# Patient Record
Sex: Female | Born: 1980
Health system: Southern US, Community
[De-identification: ages and names within clinical notes are randomized; demographics above are authoritative.]

## PROBLEM LIST (undated history)

## (undated) DIAGNOSIS — E039 Hypothyroidism, unspecified: Secondary | ICD-10-CM

## (undated) DIAGNOSIS — Z23 Encounter for immunization: Secondary | ICD-10-CM

## (undated) DIAGNOSIS — L900 Lichen sclerosus et atrophicus: Secondary | ICD-10-CM

## (undated) DIAGNOSIS — E282 Polycystic ovarian syndrome: Secondary | ICD-10-CM

## (undated) DIAGNOSIS — R87810 Cervical high risk human papillomavirus (HPV) DNA test positive: Secondary | ICD-10-CM

## (undated) DIAGNOSIS — K219 Gastro-esophageal reflux disease without esophagitis: Secondary | ICD-10-CM

## (undated) DIAGNOSIS — G43909 Migraine, unspecified, not intractable, without status migrainosus: Secondary | ICD-10-CM

## (undated) DIAGNOSIS — R87610 Atypical squamous cells of undetermined significance on cytologic smear of cervix (ASC-US): Secondary | ICD-10-CM

## (undated) HISTORY — DX: Migraine, unspecified, not intractable, without status migrainosus: G43.909

## (undated) HISTORY — DX: Cervical high risk human papillomavirus (HPV) DNA test positive: R87.810

## (undated) HISTORY — DX: Atypical squamous cells of undetermined significance on cytologic smear of cervix (ASC-US): R87.610

## (undated) HISTORY — DX: Gastro-esophageal reflux disease without esophagitis: K21.9

## (undated) HISTORY — DX: Polycystic ovarian syndrome: E28.2

## (undated) HISTORY — DX: Encounter for immunization: Z23

## (undated) HISTORY — DX: Lichen sclerosus et atrophicus: L90.0

---

## 2002-03-27 DIAGNOSIS — R87610 Atypical squamous cells of undetermined significance on cytologic smear of cervix (ASC-US): Secondary | ICD-10-CM

## 2002-03-27 HISTORY — DX: Atypical squamous cells of undetermined significance on cytologic smear of cervix (ASC-US): R87.610

## 2002-03-27 HISTORY — PX: COLPOSCOPY: SHX161

## 2003-03-28 HISTORY — PX: WISDOM TOOTH EXTRACTION: SHX21

## 2005-07-24 ENCOUNTER — Ambulatory Visit: Payer: Self-pay | Admitting: Family Medicine

## 2005-09-18 ENCOUNTER — Ambulatory Visit: Payer: Self-pay | Admitting: Family Medicine

## 2006-03-13 ENCOUNTER — Ambulatory Visit: Payer: Self-pay | Admitting: Family Medicine

## 2013-06-05 ENCOUNTER — Emergency Department: Payer: Self-pay | Admitting: Emergency Medicine

## 2013-10-17 ENCOUNTER — Emergency Department: Payer: Self-pay | Admitting: Internal Medicine

## 2013-10-17 LAB — URINALYSIS, COMPLETE
BACTERIA: NONE SEEN
BILIRUBIN, UR: NEGATIVE
Blood: NEGATIVE
Glucose,UR: NEGATIVE mg/dL (ref 0–75)
Ketone: NEGATIVE
Leukocyte Esterase: NEGATIVE
Nitrite: NEGATIVE
Ph: 5 (ref 4.5–8.0)
Protein: NEGATIVE
RBC,UR: 2 /HPF (ref 0–5)
Specific Gravity: 1.021 (ref 1.003–1.030)
WBC UR: 1 /HPF (ref 0–5)

## 2013-10-17 LAB — CBC WITH DIFFERENTIAL/PLATELET
Basophil #: 0.1 10*3/uL (ref 0.0–0.1)
Basophil %: 1 %
EOS ABS: 0.6 10*3/uL (ref 0.0–0.7)
Eosinophil %: 6.3 %
HCT: 42.3 % (ref 35.0–47.0)
HGB: 14 g/dL (ref 12.0–16.0)
LYMPHS PCT: 24.4 %
Lymphocyte #: 2.2 10*3/uL (ref 1.0–3.6)
MCH: 29 pg (ref 26.0–34.0)
MCHC: 33.2 g/dL (ref 32.0–36.0)
MCV: 87 fL (ref 80–100)
MONO ABS: 0.6 x10 3/mm (ref 0.2–0.9)
Monocyte %: 6.4 %
Neutrophil #: 5.5 10*3/uL (ref 1.4–6.5)
Neutrophil %: 61.9 %
Platelet: 372 10*3/uL (ref 150–440)
RBC: 4.84 10*6/uL (ref 3.80–5.20)
RDW: 13.2 % (ref 11.5–14.5)
WBC: 8.9 10*3/uL (ref 3.6–11.0)

## 2013-10-17 LAB — COMPREHENSIVE METABOLIC PANEL
ALK PHOS: 93 U/L
ANION GAP: 7 (ref 7–16)
AST: 27 U/L (ref 15–37)
Albumin: 3.2 g/dL — ABNORMAL LOW (ref 3.4–5.0)
BUN: 7 mg/dL (ref 7–18)
Bilirubin,Total: 0.4 mg/dL (ref 0.2–1.0)
CHLORIDE: 107 mmol/L (ref 98–107)
Calcium, Total: 8.2 mg/dL — ABNORMAL LOW (ref 8.5–10.1)
Co2: 25 mmol/L (ref 21–32)
Creatinine: 0.86 mg/dL (ref 0.60–1.30)
EGFR (Non-African Amer.): >60
Glucose: 114 mg/dL — ABNORMAL HIGH (ref 65–99)
Osmolality: 276 (ref 275–301)
Potassium: 3.8 mmol/L (ref 3.5–5.1)
SGPT (ALT): 30 U/L
SODIUM: 139 mmol/L (ref 136–145)
TOTAL PROTEIN: 7.6 g/dL (ref 6.4–8.2)

## 2014-06-09 ENCOUNTER — Emergency Department: Payer: Self-pay | Admitting: Emergency Medicine

## 2015-08-02 DIAGNOSIS — E282 Polycystic ovarian syndrome: Secondary | ICD-10-CM | POA: Diagnosis not present

## 2015-08-02 DIAGNOSIS — R5383 Other fatigue: Secondary | ICD-10-CM | POA: Diagnosis not present

## 2015-08-02 DIAGNOSIS — K219 Gastro-esophageal reflux disease without esophagitis: Secondary | ICD-10-CM | POA: Insufficient documentation

## 2015-08-02 DIAGNOSIS — N926 Irregular menstruation, unspecified: Secondary | ICD-10-CM | POA: Diagnosis not present

## 2015-08-02 DIAGNOSIS — E079 Disorder of thyroid, unspecified: Secondary | ICD-10-CM | POA: Diagnosis not present

## 2015-08-02 DIAGNOSIS — Z79899 Other long term (current) drug therapy: Secondary | ICD-10-CM | POA: Diagnosis not present

## 2015-10-27 DIAGNOSIS — M7989 Other specified soft tissue disorders: Secondary | ICD-10-CM | POA: Diagnosis not present

## 2015-10-27 DIAGNOSIS — E039 Hypothyroidism, unspecified: Secondary | ICD-10-CM | POA: Diagnosis not present

## 2015-11-13 ENCOUNTER — Telehealth: Payer: 59 | Admitting: Physician Assistant

## 2015-11-13 DIAGNOSIS — J019 Acute sinusitis, unspecified: Secondary | ICD-10-CM | POA: Diagnosis not present

## 2015-11-13 DIAGNOSIS — B9689 Other specified bacterial agents as the cause of diseases classified elsewhere: Secondary | ICD-10-CM

## 2015-11-13 MED ORDER — AMOXICILLIN-POT CLAVULANATE 875-125 MG PO TABS: 1.0000 | ORAL_TABLET | Freq: Two times a day (BID) | ORAL | 0 refills | Status: DC

## 2015-11-13 NOTE — Progress Notes (Signed)

## 2015-11-29 ENCOUNTER — Telehealth: Payer: 59 | Admitting: Family

## 2015-11-29 DIAGNOSIS — H938X3 Other specified disorders of ear, bilateral: Secondary | ICD-10-CM

## 2015-11-29 NOTE — Progress Notes (Signed)
E Visit for Swimmer's Ear  We are sorry that you are not feeling well. Here is how we plan to help!  I do not think you have swimmers ear- swimmers ear is very painful- I feel you probably have a plug of wax in your ears- treatment for this would be to get some debrox drops from pharmacy. Follow directions. If this does not work the you will need to see your PCP to have ears washed out and examined. You matyy also try taking a oral decongestant that will help get any fluid out from behind er drum.    In certain cases swimmer's ear may progress to a more serious bacterial infection of the middle or inner ear.  If you have a fever 102 and up and significantly worsening symptoms, this could indicate a more serious infection moving to the middle/inner and needs face to face evaluation in an office by a provider.  Your symptoms should improve over the next 3 days and should resolve in about 7 days.  HOME CARE:   Wash your hands frequently.  Do not place the tip of the bottle on your ear or touch it with your fingers.  You can take Acetominophen 650 mg every 4-6 hours as needed for pain.  If pain is severe or moderate, you can apply a heating pad (set on low) or hot water bottle (wrapped in a towel) to outer ear for 20 minutes.  This will also increase drainage.  Avoid ear plugs  Do not use Q-tips  After showers, help the water run out by tilting your head to one side.  GET HELP RIGHT AWAY IF:   Fever is over 102.2 degrees.  You develop progressive ear pain or hearing loss.  Ear symptoms persist longer than 3 days after treatment.  MAKE SURE YOU:   Understand these instructions.  Will watch your condition.  Will get help right away if you are not doing well or get worse.  TO PREVENT SWIMMER'S EAR:  Use a bathing cap or custom fitted swim molds to keep your ears dry.  Towel off after swimming to dry your ears.  Tilt your head or pull your earlobes to allow the water to  escape your ear canal.  If there is still water in your ears, consider using a hairdryer on the lowest setting.  Thank you for choosing an e-visit. Your e-visit answers were reviewed by a board certified advanced clinical practitioner to complete your personal care plan. Depending upon the condition, your plan could have included both over the counter or prescription medications. Please review your pharmacy choice. Be sure that the pharmacy you have chosen is open so that you can pick up your prescription now.  If there is a problem you may message your provider in [place] to have the prescription routed to another pharmacy. Your safety is important to [place]. If you have drug allergies check your prescription carefully.  For the next 24 hours, you can use MyChart to ask questions about today's visit, request a non-urgent call back, or ask for a work or school excuse from your e-visit provider. You will get an email in the next two days asking about your experience. I hope that your e-visit has been valuable and will speed your recovery.

## 2016-02-02 DIAGNOSIS — Z13220 Encounter for screening for lipoid disorders: Secondary | ICD-10-CM | POA: Diagnosis not present

## 2016-02-02 DIAGNOSIS — Z79899 Other long term (current) drug therapy: Secondary | ICD-10-CM | POA: Diagnosis not present

## 2016-02-02 DIAGNOSIS — Z6841 Body Mass Index (BMI) 40.0 and over, adult: Secondary | ICD-10-CM | POA: Diagnosis not present

## 2016-02-02 DIAGNOSIS — E559 Vitamin D deficiency, unspecified: Secondary | ICD-10-CM | POA: Diagnosis not present

## 2016-02-02 DIAGNOSIS — Z0000 Encounter for general adult medical examination without abnormal findings: Secondary | ICD-10-CM | POA: Diagnosis not present

## 2016-02-02 DIAGNOSIS — E039 Hypothyroidism, unspecified: Secondary | ICD-10-CM | POA: Diagnosis not present

## 2016-03-30 ENCOUNTER — Emergency Department
Admission: EM | Admit: 2016-03-30 | Discharge: 2016-03-30 | Disposition: A | Discharge status: Home / Self Care | Payer: 59 | Attending: Emergency Medicine | Admitting: Emergency Medicine

## 2016-03-30 ENCOUNTER — Encounter: Payer: Self-pay | Admitting: Emergency Medicine

## 2016-03-30 DIAGNOSIS — R197 Diarrhea, unspecified: Secondary | ICD-10-CM

## 2016-03-30 DIAGNOSIS — A0811 Acute gastroenteropathy due to Norwalk agent: Secondary | ICD-10-CM | POA: Insufficient documentation

## 2016-03-30 DIAGNOSIS — R112 Nausea with vomiting, unspecified: Secondary | ICD-10-CM

## 2016-03-30 DIAGNOSIS — E039 Hypothyroidism, unspecified: Secondary | ICD-10-CM | POA: Diagnosis not present

## 2016-03-30 DIAGNOSIS — Z79899 Other long term (current) drug therapy: Secondary | ICD-10-CM | POA: Diagnosis not present

## 2016-03-30 HISTORY — DX: Hypothyroidism, unspecified: E03.9

## 2016-03-30 LAB — GASTROINTESTINAL PANEL BY PCR, STOOL (REPLACES STOOL CULTURE)
ASTROVIRUS: NOT DETECTED
Adenovirus F40/41: NOT DETECTED
CAMPYLOBACTER SPECIES: NOT DETECTED
Cryptosporidium: NOT DETECTED
Cyclospora cayetanensis: NOT DETECTED
ENTEROTOXIGENIC E COLI (ETEC): NOT DETECTED
Entamoeba histolytica: NOT DETECTED
Enteroaggregative E coli (EAEC): NOT DETECTED
Enteropathogenic E coli (EPEC): NOT DETECTED
Giardia lamblia: NOT DETECTED
NOROVIRUS GI/GII: DETECTED — AB
PLESIMONAS SHIGELLOIDES: NOT DETECTED
Rotavirus A: NOT DETECTED
SAPOVIRUS (I, II, IV, AND V): NOT DETECTED
SHIGA LIKE TOXIN PRODUCING E COLI (STEC): NOT DETECTED
Salmonella species: NOT DETECTED
Shigella/Enteroinvasive E coli (EIEC): NOT DETECTED
Vibrio cholerae: NOT DETECTED
Vibrio species: NOT DETECTED
Yersinia enterocolitica: NOT DETECTED

## 2016-03-30 LAB — C DIFFICILE QUICK SCREEN W PCR REFLEX
C DIFFICILE (CDIFF) INTERP: NOT DETECTED
C DIFFICILE (CDIFF) TOXIN: NEGATIVE
C DIFFICLE (CDIFF) ANTIGEN: NEGATIVE

## 2016-03-30 LAB — URINALYSIS, COMPLETE (UACMP) WITH MICROSCOPIC
Bacteria, UA: NONE SEEN
Bilirubin Urine: NEGATIVE
GLUCOSE, UA: NEGATIVE mg/dL
Hgb urine dipstick: NEGATIVE
KETONES UR: 20 mg/dL — AB
Leukocytes, UA: NEGATIVE
Nitrite: NEGATIVE
PH: 5 (ref 5.0–8.0)
Protein, ur: NEGATIVE mg/dL
Specific Gravity, Urine: 1.026 (ref 1.005–1.030)
WBC, UA: NONE SEEN WBC/hpf (ref 0–5)

## 2016-03-30 LAB — CBC WITH DIFFERENTIAL/PLATELET
Basophils Absolute: 0 10*3/uL (ref 0–0.1)
Basophils Relative: 0 %
Eosinophils Absolute: 0 10*3/uL (ref 0–0.7)
Eosinophils Relative: 0 %
HEMATOCRIT: 42.4 % (ref 35.0–47.0)
HEMOGLOBIN: 14 g/dL (ref 12.0–16.0)
LYMPHS ABS: 0.7 10*3/uL — AB (ref 1.0–3.6)
Lymphocytes Relative: 5 %
MCH: 28.3 pg (ref 26.0–34.0)
MCHC: 33.1 g/dL (ref 32.0–36.0)
MCV: 85.6 fL (ref 80.0–100.0)
MONO ABS: 0.8 10*3/uL (ref 0.2–0.9)
MONOS PCT: 6 %
NEUTROS ABS: 12.8 10*3/uL — AB (ref 1.4–6.5)
NEUTROS PCT: 89 %
Platelets: 338 10*3/uL (ref 150–440)
RBC: 4.95 MIL/uL (ref 3.80–5.20)
RDW: 14.8 % — ABNORMAL HIGH (ref 11.5–14.5)
WBC: 14.3 10*3/uL — ABNORMAL HIGH (ref 3.6–11.0)

## 2016-03-30 LAB — COMPREHENSIVE METABOLIC PANEL
ALK PHOS: 91 U/L (ref 38–126)
ALT: 23 U/L (ref 14–54)
ANION GAP: 7 (ref 5–15)
AST: 41 U/L (ref 15–41)
Albumin: 3.8 g/dL (ref 3.5–5.0)
BILIRUBIN TOTAL: 1.2 mg/dL (ref 0.3–1.2)
BUN: 12 mg/dL (ref 6–20)
CO2: 21 mmol/L — ABNORMAL LOW (ref 22–32)
Calcium: 8.5 mg/dL — ABNORMAL LOW (ref 8.9–10.3)
Chloride: 104 mmol/L (ref 101–111)
Creatinine, Ser: 0.76 mg/dL (ref 0.44–1.00)
Glucose, Bld: 118 mg/dL — ABNORMAL HIGH (ref 65–99)
Potassium: 4.7 mmol/L (ref 3.5–5.1)
Sodium: 132 mmol/L — ABNORMAL LOW (ref 135–145)
TOTAL PROTEIN: 7.6 g/dL (ref 6.5–8.1)

## 2016-03-30 LAB — LIPASE, BLOOD: LIPASE: 18 U/L (ref 11–51)

## 2016-03-30 LAB — POCT PREGNANCY, URINE: Preg Test, Ur: NEGATIVE

## 2016-03-30 LAB — RAPID INFLUENZA A&B ANTIGENS (ARMC ONLY): INFLUENZA B (ARMC): NEGATIVE

## 2016-03-30 LAB — RAPID INFLUENZA A&B ANTIGENS: Influenza A (ARMC): NEGATIVE

## 2016-03-30 MED ORDER — ONDANSETRON HCL 4 MG/2ML IJ SOLN
4.0000 mg | Freq: Once | INTRAMUSCULAR | Status: AC
  Administered 2016-03-30: 4 mg via INTRAVENOUS
  Filled 2016-03-30: qty 2

## 2016-03-30 MED ORDER — LOPERAMIDE HCL 2 MG PO TABS: 2.0000 mg | ORAL_TABLET | Freq: Four times a day (QID) | ORAL | 0 refills | Status: DC | PRN

## 2016-03-30 MED ORDER — ACETAMINOPHEN 500 MG PO TABS
1000.0000 mg | ORAL_TABLET | Freq: Once | ORAL | Status: AC
  Administered 2016-03-30: 1000 mg via ORAL
  Filled 2016-03-30: qty 2

## 2016-03-30 MED ORDER — SODIUM CHLORIDE 0.9 % IV BOLUS (SEPSIS)
1000.0000 mL | Freq: Once | INTRAVENOUS | Status: AC
  Administered 2016-03-30: 1000 mL via INTRAVENOUS

## 2016-03-30 MED ORDER — ONDANSETRON HCL 4 MG PO TABS: 4.0000 mg | ORAL_TABLET | Freq: Every day | ORAL | 0 refills | Status: DC | PRN

## 2016-03-30 MED ORDER — ONDANSETRON HCL 4 MG/2ML IJ SOLN
INTRAMUSCULAR | Status: AC
  Filled 2016-03-30: qty 2

## 2016-03-30 NOTE — ED Provider Notes (Signed)
Patient presented to the emergency department with nausea vomiting and diarrhea. Abdominal pain free. Pending stool studies at this time. Able to take sips off of soda without vomiting at this time.   Physical Exam  BP 119/68   Pulse 98   Temp 98.3 F (36.8 C) (Oral)   Resp 16   Ht 5\' 2"  (1.575 m)   Wt 240 lb (108.9 kg)   LMP 03/06/2016   SpO2 91%   BMI 43.90 kg/m  ----------------------------------------- 10:34 AM on 03/30/2016 -----------------------------------------   Physical Exam Patient resting comfortably at this time. No distress.  ED Course  Procedures  MDM Updated patient about her diagnosis and the need to stay hydrated. She is understanding the plan for discharge to home. She'll be discharged with Zofran as well as Imodium.       Orbie Pyo, MD 03/30/16 1034

## 2016-03-30 NOTE — ED Provider Notes (Signed)
[facility] Emergency Department Provider Note    First MD Initiated Contact with Patient 03/30/16 [phone]     (approximate)  I have reviewed the triage vital signs and the nursing notes.   HISTORY  Chief Complaint Chills; Emesis; and Diarrhea   HPI Erika Henry is a 36 y.o. female with history of hypothyroidism and PCA was presents to the emergency department abrupt onset of generalized muscle aches last night followed by abrupt onset of nonbloody vomiting and diarrhea of which patient states she's had multiple episodes. Patient also admits to fever at home and chills. Patient states that she took Tylenol at 11:45 PM. Patient denies any abdominal discomfort. Patient denies any urinary symptoms.   Past Medical History:  Diagnosis Date  . Hypothyroid     There are no active problems to display for this patient.   History reviewed. No pertinent surgical history.  Prior to Admission medications   Medication Sig Start Date End Date Taking? Authorizing Provider  levothyroxine (SYNTHROID, LEVOTHROID) 88 MCG tablet Take 88 mcg by mouth daily before breakfast.   Yes Historical Provider, MD  Multiple Vitamin (MULTIVITAMIN WITH MINERALS) TABS tablet Take 1 tablet by mouth daily.   Yes Historical Provider, MD  omeprazole (PRILOSEC) 20 MG capsule Take 20 mg by mouth daily.   Yes Historical Provider, MD  amoxicillin-clavulanate (AUGMENTIN) 875-125 MG tablet Take 1 tablet by mouth 2 (two) times daily. Patient not taking: Reported on 03/30/2016 11/13/15   Brunetta Jeans, PA-C    Allergies Patient has no known allergies.  History reviewed. No pertinent family history.  Social History Social History  Substance Use Topics  . Smoking status: Never Smoker  . Smokeless tobacco: Never Used  . Alcohol use No    Review of Systems Constitutional: Positive for fever/chills Eyes: No visual changes. ENT: No sore throat. Cardiovascular: Denies chest  pain. Respiratory: Denies shortness of breath. Gastrointestinal: Positive for vomiting and diarrhea Genitourinary: Negative for dysuria. Musculoskeletal: Negative for back pain. Positive for generalized muscle aches Skin: Negative for rash. Neurological: Negative for headaches, focal weakness or numbness.  10-point ROS otherwise negative.  ____________________________________________   PHYSICAL EXAM:  VITAL SIGNS: ED Triage Vitals  Enc Vitals Group     BP 03/30/16 0417 137/84     Pulse Rate 03/30/16 0417 100     Resp 03/30/16 0417 (!) 22     Temp 03/30/16 0417 98.3 F (36.8 C)     Temp Source 03/30/16 0417 Oral     SpO2 03/30/16 0417 94 %     Weight 03/30/16 0418 240 lb (108.9 kg)     Height 03/30/16 0418 5\' 2"  (1.575 m)     Head Circumference --      Peak Flow --      Pain Score 03/30/16 0418 3     Pain Loc --      Pain Edu? --      Excl. in [place]? --     Constitutional: Alert and oriented. Well appearing and in no acute distress. Eyes: Conjunctivae are normal. PERRL. EOMI. Head: Atraumatic.[name] Kitchen Mouth/Throat: Mucous membranes are moist.  Oropharynx non-erythematous. Neck: No stridor.  No meningeal signs.  Cardiovascular: Normal rate, regular rhythm. Good peripheral circulation. Grossly normal heart sounds. Respiratory: Normal respiratory effort.  No retractions. Lungs CTAB. Gastrointestinal: Soft and nontender. No distention. Musculoskeletal: No lower extremity tenderness nor edema. No gross deformities of extremities. Neurologic:  Normal speech and language. No gross focal neurologic deficits are appreciated.  Skin:  Skin is warm, dry and intact. No rash noted. Psychiatric: Mood and affect are normal. Speech and behavior are normal.  ____________________________________________   LABS (all labs ordered are listed, but only abnormal results are displayed)  Labs Reviewed  CBC WITH DIFFERENTIAL/PLATELET - Abnormal; Notable for the following:       Result Value   WBC  14.3 (*)    RDW 14.8 (*)    Neutro Abs 12.8 (*)    Lymphs Abs 0.7 (*)    All other components within normal limits  COMPREHENSIVE METABOLIC PANEL - Abnormal; Notable for the following:    Sodium 132 (*)    CO2 21 (*)    Glucose, Bld 118 (*)    Calcium 8.5 (*)    All other components within normal limits  URINALYSIS, COMPLETE (UACMP) WITH MICROSCOPIC - Abnormal; Notable for the following:    Color, Urine AMBER (*)    APPearance TURBID (*)    Ketones, ur 20 (*)    Squamous Epithelial / LPF 0-5 (*)    All other components within normal limits  RAPID INFLUENZA A&B ANTIGENS ([facility] ONLY)  GASTROINTESTINAL PANEL BY PCR, STOOL (REPLACES STOOL CULTURE)  C DIFFICILE QUICK SCREEN W PCR REFLEX  LIPASE, BLOOD  POC URINE PREG, ED  POCT PREGNANCY, URINE    Procedures   ___   INITIAL IMPRESSION / ASSESSMENT AND PLAN / ED COURSE  Pertinent labs & imaging results that were available during my care of the patient were reviewed by me and considered in my medical decision making (see chart for details).  36 year old female presenting to the emergency department with nonbloody emesis and diarrhea times multiple episodes tonight. Patient given IV Zofran 4 mg as well as 2 L IV normal saline. No abdominal pain elicited on exam and therefore no imaging was performed. Suspect possible viral/bacterial etiology and gastroenteritis. Stool studies pending at this time. Patient's care transferred to [place]     ____________________________________________  FINAL CLINICAL IMPRESSION(S) / ED DIAGNOSES  Vomiting and diarrhea  MEDICATIONS GIVEN DURING THIS VISIT:  Medications  sodium chloride 0.9 % bolus 1,000 mL (0 mLs Intravenous Stopped 03/30/16 0543)  ondansetron (ZOFRAN) injection 4 mg (4 mg Intravenous Given 03/30/16 0503)     NEW OUTPATIENT MEDICATIONS STARTED DURING THIS VISIT:  New Prescriptions   No medications on file    Modified Medications   No medications  on file    Discontinued Medications   No medications on file     Note:  This document was prepared using Dragon voice recognition software and may include unintentional dictation errors.    Gregor Hams, MD 03/30/16 [phone]

## 2016-03-30 NOTE — ED Triage Notes (Signed)
Pt ambulatory to tx room in [facility], reports this evening had fever and chills, then has had vomiting and diarrhea, too many times to count.  Reports took tylenol at 2345 last night.

## 2016-05-24 DIAGNOSIS — E039 Hypothyroidism, unspecified: Secondary | ICD-10-CM | POA: Insufficient documentation

## 2016-05-24 DIAGNOSIS — K219 Gastro-esophageal reflux disease without esophagitis: Secondary | ICD-10-CM | POA: Diagnosis not present

## 2016-05-24 DIAGNOSIS — Z131 Encounter for screening for diabetes mellitus: Secondary | ICD-10-CM | POA: Diagnosis not present

## 2016-05-24 DIAGNOSIS — E559 Vitamin D deficiency, unspecified: Secondary | ICD-10-CM | POA: Insufficient documentation

## 2016-05-24 DIAGNOSIS — Z6841 Body Mass Index (BMI) 40.0 and over, adult: Secondary | ICD-10-CM | POA: Insufficient documentation

## 2016-06-09 DIAGNOSIS — E039 Hypothyroidism, unspecified: Secondary | ICD-10-CM | POA: Diagnosis not present

## 2016-06-09 DIAGNOSIS — R634 Abnormal weight loss: Secondary | ICD-10-CM | POA: Diagnosis not present

## 2016-06-09 DIAGNOSIS — E282 Polycystic ovarian syndrome: Secondary | ICD-10-CM | POA: Diagnosis not present

## 2016-06-09 DIAGNOSIS — Z3169 Encounter for other general counseling and advice on procreation: Secondary | ICD-10-CM | POA: Diagnosis not present

## 2016-06-13 DIAGNOSIS — Z3049 Encounter for surveillance of other contraceptives: Secondary | ICD-10-CM | POA: Diagnosis not present

## 2016-06-13 DIAGNOSIS — Z3009 Encounter for other general counseling and advice on contraception: Secondary | ICD-10-CM | POA: Diagnosis not present

## 2016-06-19 DIAGNOSIS — H52223 Regular astigmatism, bilateral: Secondary | ICD-10-CM | POA: Diagnosis not present

## 2016-06-19 DIAGNOSIS — H5213 Myopia, bilateral: Secondary | ICD-10-CM | POA: Diagnosis not present

## 2016-06-21 DIAGNOSIS — L400 Psoriasis vulgaris: Secondary | ICD-10-CM | POA: Diagnosis not present

## 2016-06-21 DIAGNOSIS — Z808 Family history of malignant neoplasm of other organs or systems: Secondary | ICD-10-CM | POA: Diagnosis not present

## 2016-06-21 DIAGNOSIS — L650 Telogen effluvium: Secondary | ICD-10-CM | POA: Diagnosis not present

## 2016-06-21 DIAGNOSIS — L99 Other disorders of skin and subcutaneous tissue in diseases classified elsewhere: Secondary | ICD-10-CM | POA: Diagnosis not present

## 2016-07-04 ENCOUNTER — Telehealth: Payer: 59 | Admitting: Nurse Practitioner

## 2016-07-04 DIAGNOSIS — J019 Acute sinusitis, unspecified: Secondary | ICD-10-CM | POA: Diagnosis not present

## 2016-07-04 MED ORDER — FLUTICASONE PROPIONATE 50 MCG/ACT NA SUSP: 2.0000 | Freq: Every day | NASAL | 0 refills | Status: DC

## 2016-07-04 MED ORDER — AZITHROMYCIN 250 MG PO TABS: ORAL_TABLET | ORAL | 0 refills | Status: AC

## 2016-07-04 NOTE — Progress Notes (Signed)
We are sorry that you are not feeling well.  Here is how we plan to help!  Based on what you have shared with me it looks like you have sinusitis.  Sinusitis is inflammation and infection in the sinus cavities of the head.  Based on your presentation I believe you most likely have Acute Viral Sinusitis.This is an infection most likely caused by a virus. There is not specific treatment for viral sinusitis other than to help you with the symptoms until the infection runs its course.  You may use an oral decongestant such as Mucinex D or if you have glaucoma or high blood pressure use plain Mucinex. Saline nasal spray help and can safely be used as often as needed for congestion, I have prescribed: Fluticasone nasal spray two sprays in each nostril twice a day.  Due to the severity of your symptoms, I am also prescribing Azithromycin 250mg  to take as directed.   Some authorities believe that zinc sprays or the use of Echinacea may shorten the course of your symptoms.  Sinus infections are not as easily transmitted as other respiratory infection, however we still recommend that you avoid close contact with loved ones, especially the very young and elderly.  Remember to wash your hands thoroughly throughout the day as this is the number one way to prevent the spread of infection!  Home Care:  Only take medications as instructed by your medical team.  Complete the entire course of an antibiotic.  Do not take these medications with alcohol.  A steam or ultrasonic humidifier can help congestion.  You can place a towel over your head and breathe in the steam from hot water coming from a faucet.  Avoid close contacts especially the very young and the elderly.  Cover your mouth when you cough or sneeze.  Always remember to wash your hands.  Get Help Right Away If:  You develop worsening fever or sinus pain.  You develop a severe head ache or visual changes.  Your symptoms persist after you have  completed your treatment plan.  Make sure you  Understand these instructions.  Will watch your condition.  Will get help right away if you are not doing well or get worse.  Your e-visit answers were reviewed by a board certified advanced clinical practitioner to complete your personal care plan.  Depending on the condition, your plan could have included both over the counter or prescription medications.  If there is a problem please reply  once you have received a response from your provider.  Your safety is important to [place].  If you have drug allergies check your prescription carefully.    You can use MyChart to ask questions about today's visit, request a non-urgent call back, or ask for a work or school excuse for 24 hours related to this e-Visit. If it has been greater than 24 hours you will need to follow up with your provider, or enter a new e-Visit to address those concerns.  You will get an e-mail in the next two days asking about your experience.  I hope that your e-visit has been valuable and will speed your recovery. Thank you for using e-visits.

## 2016-07-06 ENCOUNTER — Ambulatory Visit: Payer: Self-pay | Admitting: Physician Assistant

## 2016-07-06 ENCOUNTER — Encounter: Payer: Self-pay | Admitting: Physician Assistant

## 2016-07-06 VITALS — BP 140/80 | HR 107 | Temp 98.5°F

## 2016-07-06 DIAGNOSIS — R509 Fever, unspecified: Secondary | ICD-10-CM

## 2016-07-06 DIAGNOSIS — J209 Acute bronchitis, unspecified: Secondary | ICD-10-CM

## 2016-07-06 LAB — POCT INFLUENZA A/B
INFLUENZA A, POC: NEGATIVE
INFLUENZA B, POC: NEGATIVE

## 2016-07-06 MED ORDER — ALBUTEROL SULFATE HFA 108 (90 BASE) MCG/ACT IN AERS: 2.0000 | INHALATION_SPRAY | Freq: Four times a day (QID) | RESPIRATORY_TRACT | 0 refills | Status: DC | PRN

## 2016-07-06 MED ORDER — IPRATROPIUM-ALBUTEROL 0.5-2.5 (3) MG/3ML IN SOLN
3.0000 mL | Freq: Once | RESPIRATORY_TRACT | Status: AC
  Administered 2016-07-06: 3 mL via RESPIRATORY_TRACT

## 2016-07-06 MED ORDER — METHYLPREDNISOLONE 4 MG PO TBPK: ORAL_TABLET | ORAL | 0 refills | Status: DC

## 2016-07-06 MED ORDER — IPRATROPIUM-ALBUTEROL 0.5-2.5 (3) MG/3ML IN SOLN: 3.0000 mL | Freq: Four times a day (QID) | RESPIRATORY_TRACT | Status: DC

## 2016-07-06 MED ORDER — BENZONATATE 200 MG PO CAPS: 200.0000 mg | ORAL_CAPSULE | Freq: Three times a day (TID) | ORAL | 0 refills | Status: DC | PRN

## 2016-07-06 NOTE — Progress Notes (Signed)
S: C/o runny nose and congestion with dry cough for 3-4 days, + fever, chills earlier in the week, denies cp/sob, v/d; mucus was green this am but clear throughout the day, cough is sporadic, causes her head to hurt, called e-visit and they gave her a zpack  Using otc meds: robitussin  O: PE: vitals: pulse ox is low and hr is increased, nad,  perrl eomi, normocephalic, tms dull, nasal mucosa red and swollen, throat injected, neck supple no lymph, lungs c t a, cv rrr, neuro intact, flu swab neg, svn duoneb given, pulse ox increased to 94-95%, pt states she feels better  A:  Acute flu like illness, bronchitis   P: drink fluids, continue regular meds , use otc meds of choice, return if not improving in 5 days, return earlier if worsening . Medrol dose pack, albuterol inhaler, tessalon perls

## 2016-07-31 DIAGNOSIS — Z3049 Encounter for surveillance of other contraceptives: Secondary | ICD-10-CM | POA: Diagnosis not present

## 2016-07-31 DIAGNOSIS — R030 Elevated blood-pressure reading, without diagnosis of hypertension: Secondary | ICD-10-CM | POA: Diagnosis not present

## 2016-08-05 ENCOUNTER — Telehealth: Payer: 59 | Admitting: Physician Assistant

## 2016-08-05 DIAGNOSIS — H5710 Ocular pain, unspecified eye: Secondary | ICD-10-CM

## 2016-08-05 DIAGNOSIS — S058X9A Other injuries of unspecified eye and orbit, initial encounter: Secondary | ICD-10-CM

## 2016-08-05 NOTE — Progress Notes (Signed)
Based on what you shared with me it looks like you have a condition that should be evaluated in a face to face office visit.  Giving trauma there is concern for scratched cornea. Giving the amount of pain you need to be seen so a good eye examination can be performed and correct medication to be given.   NOTE: Even if you have entered your credit card information for this eVisit, you will not be charged.   If you are having a true medical emergency please call 911.  If you need an urgent face to face visit, [place] has four urgent care centers for your convenience.  If you need care fast and have a high deductible or no insurance consider:   [email or web address]  [phone]  [address], [place], [place] [address] 8 am to 8 pm Monday-Friday 10 am to 4 pm Saturday-Sunday   The following sites will take your  insurance:    . [facility] [place] a Provider at this Location  [address] [place], [place] [address] . 10 am to 8 pm Monday-Friday . 12 pm to 8 pm Saturday-Sunday   . [facility] [place] a Provider at this Location  [address], [place], [place] [address] . 8 am to 8 pm Monday-Friday . 9 am to 6 pm Saturday . 11 am to 6 pm Sunday   . [facility] [place] Get Driving Directions  [address], [place] [address] . 8 am to 8 pm Monday-Friday . 8 am to 4 pm Saturday-Sunday   Your e-visit answers were reviewed by a board certified advanced clinical practitioner to complete your personal care plan.  Thank you for using e-Visits.

## 2016-08-06 ENCOUNTER — Telehealth: Payer: 59 | Admitting: Family

## 2016-08-06 DIAGNOSIS — H1032 Unspecified acute conjunctivitis, left eye: Secondary | ICD-10-CM | POA: Diagnosis not present

## 2016-08-06 MED ORDER — POLYMYXIN B-TRIMETHOPRIM 10000-0.1 UNIT/ML-% OP SOLN: 1.0000 [drp] | Freq: Four times a day (QID) | OPHTHALMIC | 0 refills | Status: DC

## 2016-08-06 NOTE — Progress Notes (Signed)

## 2017-02-09 ENCOUNTER — Ambulatory Visit (INDEPENDENT_AMBULATORY_CARE_PROVIDER_SITE_OTHER): Payer: 59 | Admitting: Primary Care

## 2017-02-09 ENCOUNTER — Encounter: Payer: Self-pay | Admitting: Primary Care

## 2017-02-09 VITALS — BP 114/70 | HR 62 | Temp 98.6°F | Ht 62.0 in | Wt 256.1 lb

## 2017-02-09 DIAGNOSIS — Z6841 Body Mass Index (BMI) 40.0 and over, adult: Secondary | ICD-10-CM

## 2017-02-09 DIAGNOSIS — K219 Gastro-esophageal reflux disease without esophagitis: Secondary | ICD-10-CM

## 2017-02-09 DIAGNOSIS — E039 Hypothyroidism, unspecified: Secondary | ICD-10-CM | POA: Diagnosis not present

## 2017-02-09 DIAGNOSIS — E282 Polycystic ovarian syndrome: Secondary | ICD-10-CM

## 2017-02-09 DIAGNOSIS — L409 Psoriasis, unspecified: Secondary | ICD-10-CM

## 2017-02-09 LAB — TSH: TSH: 3.21 u[IU]/mL (ref 0.35–4.50)

## 2017-02-09 LAB — T4, FREE: FREE T4: 1.09 ng/dL (ref 0.60–1.60)

## 2017-02-09 MED ORDER — OMEPRAZOLE 20 MG PO CPDR: 20.0000 mg | DELAYED_RELEASE_CAPSULE | Freq: Every day | ORAL | 0 refills | Status: DC | PRN

## 2017-02-09 MED ORDER — BETAMETHASONE DIPROPIONATE 0.05 % EX EMUL: 1.0000 | Freq: Every evening | CUTANEOUS | 0 refills | Status: DC | PRN

## 2017-02-09 NOTE — Assessment & Plan Note (Addendum)
Diagnosed in 2008.  Recheck TSH today. Labs from Care Everywhere reviewed from February 2018. Repeat thyroid ultrasound given goiter history and no recent imaging.

## 2017-02-09 NOTE — Assessment & Plan Note (Signed)
Discussed to increase vegetables, fruit, whole grains, lean protein. Limit sweets.  Discussed downloading free Apps such as My Fitness Pal for calorie tracking. Discussed to limit calories to 1400-1500 daily.

## 2017-02-09 NOTE — Progress Notes (Signed)
Subjective:    Patient ID: Erika Henry, female    DOB: Mar 05, 1981, 36 y.o.   MRN: [id number]  HPI  [name] is a 36 year old female who presents today to establish care and discuss the problems mentioned below. Will obtain old records.  1) Hypothyroidism: Diagnosed in 2008. Currently managed on levothyroxine 88 mcg. Her last TSH was 4.030 in February 2018. She has recently noticed hair loss over the past 3 months. History of goiter, last ultrasound was around 2011. She's been on the 88 mcg dose of her levothyroxine for over one year.   2) GERD: Currently managed on omeprazole 20 mg for which she takes on an as needed basis. She typically takes this once to twice weekly. She is needing a refill today.  3) Psoriasis: Currently managed on betamethasone dipropionate for which she uses at bedtime PRN. She is needing a refill of her   4) Morbid Obesity: Struggled with for years. She endorses a healthy diet and regular exercise.  Diet currently consists of:  Breakfast: Atkins shake, eggs and bacon Lunch: Sandwich, pretzels, fruit and peanut butter, yogurt Dinner: Baked chicken, some red meat, green beans, potatoes, cabbage, broccoli  Snacks: Pretzels, fruit with peanut butter, chex mix, [employer] Desserts: Daily, small portion Beverages: Water, diet soda daily, socially  Exercise: She does an exercise class at the gym twice weekly, sometimes works out on Saturday.    Review of Systems  Constitutional: Negative for fatigue.  Respiratory: Negative for shortness of breath.   Cardiovascular: Negative for chest pain.  Gastrointestinal:       Intermittent GERD  Endocrine:       Hair loss  Skin:       Psoriasis        Past Medical History:  Diagnosis Date  . GERD (gastroesophageal reflux disease)   . Hypothyroid   . Migraines   . PCOS (polycystic ovarian syndrome)      Social History   Socioeconomic History  . Marital status: Married    Spouse name: Not on file  .  Number of children: Not on file  . Years of education: Not on file  . Highest education level: Not on file  Social Needs  . Financial resource strain: Not on file  . Food insecurity - worry: Not on file  . Food insecurity - inability: Not on file  . Transportation needs - medical: Not on file  . Transportation needs - non-medical: Not on file  Occupational History  . Not on file  Tobacco Use  . Smoking status: Never Smoker  . Smokeless tobacco: Never Used  Substance and Sexual Activity  . Alcohol use: No  . Drug use: Not on file  . Sexual activity: Not on file  Other Topics Concern  . Not on file  Social History Narrative   Married.   No children.   Works as a Technical brewer at [employer].   Enjoys exercising, shopping, bowling, traveling.    No past surgical history on file.  Family History  Problem Relation Age of Onset  . Hyperlipidemia Mother   . Hypertension Mother   . Hyperlipidemia Father   . Hypertension Father   . Depression Brother   . Depression Maternal Grandmother   . Diabetes Maternal Grandmother   . Stroke Maternal Grandmother   . COPD Maternal Grandfather   . Diabetes Maternal Grandfather   . Stroke Maternal Grandfather   . Depression Paternal Grandmother   . Breast cancer  Paternal Grandmother   . Arthritis Paternal Grandfather   . Cancer Paternal Grandfather   . Heart disease Paternal Grandfather   . Hyperlipidemia Paternal Grandfather   . Hypertension Paternal Grandfather   . Kidney disease Paternal Grandfather     No Known Allergies  Current Outpatient Medications on File Prior to Visit  Medication Sig Dispense Refill  . Biotin 2500 MCG CAPS Take by mouth.    . Cholecalciferol (VITAMIN D3) 2000 units capsule Take by mouth.    . levothyroxine (SYNTHROID, LEVOTHROID) 88 MCG tablet Take 88 mcg by mouth daily before breakfast.    . Multiple Vitamin (MULTI-VITAMINS) TABS Take by mouth.     No current facility-administered medications on file  prior to visit.     BP 114/70   Pulse 62   Temp 98.6 F (37 C) (Oral)   Ht 5\' 2"  (1.575 m)   Wt 256 lb 1.9 oz (116.2 kg)   LMP 01/24/2017   SpO2 97%   BMI 46.84 kg/m    Objective:   Physical Exam  Constitutional: She appears well-nourished.  Neck: Neck supple. No thyromegaly present.  No nodules palpated, however, exam difficulty due to obesity  Cardiovascular: Normal rate and regular rhythm.  Pulmonary/Chest: Effort normal and breath sounds normal.  Skin: Skin is warm and dry.  Psychiatric: She has a normal mood and affect.          Assessment & Plan:

## 2017-02-09 NOTE — Assessment & Plan Note (Signed)
Doing well on betamethasone spray PRN. Continue same and will refill now. Recommended that she follow back up with dermatology if symptoms do not improve with spray.

## 2017-02-09 NOTE — Patient Instructions (Addendum)
Complete lab work prior to leaving today. I will notify you of your results once received.   You will be contacted regarding your ultrasound of the thyroid gland.  Please let us know if you have not heard back within one week.   I sent refills for your skin spray and omeprazole to the pharmacy.  Start exercising. You should be getting 150 minutes of moderate intensity exercise weekly.  Down load the App My Fitness Pal for calorie tracking.  Increase consumption of vegetables, fruit, whole grains, lean protein. Reduce sweets.  Ensure you are consuming 64 ounces of water daily.  It was a pleasure to meet you today! Please don't hesitate to call me with any questions. Welcome to [facility]!

## 2017-02-09 NOTE — Assessment & Plan Note (Signed)
Taking omeprazole 20 mg PRN. Discussed trigger foods for GERD. Encouraged weight loss.

## 2017-02-09 NOTE — Assessment & Plan Note (Signed)
Diagnosed several years ago, confirmed on ultrasound. Also with history of fibroid. Currently following with GYN.

## 2017-02-10 ENCOUNTER — Ambulatory Visit (HOSPITAL_BASED_OUTPATIENT_CLINIC_OR_DEPARTMENT_OTHER)
Admission: RE | Admit: 2017-02-10 | Discharge: 2017-02-10 | Disposition: A | Discharge status: Home / Self Care | Payer: 59 | Source: Ambulatory Visit | Attending: Primary Care | Admitting: Primary Care

## 2017-02-10 DIAGNOSIS — E041 Nontoxic single thyroid nodule: Secondary | ICD-10-CM | POA: Diagnosis not present

## 2017-02-10 DIAGNOSIS — E039 Hypothyroidism, unspecified: Secondary | ICD-10-CM | POA: Insufficient documentation

## 2017-02-12 ENCOUNTER — Telehealth: Payer: Self-pay | Admitting: Primary Care

## 2017-02-12 DIAGNOSIS — L409 Psoriasis, unspecified: Secondary | ICD-10-CM

## 2017-02-12 NOTE — Telephone Encounter (Signed)
Received faxed from [place] that need to change   Betamethasone Dipropionate (SERNIVO) 0.05 %   Noted from the pharmacy: Insurance requires trial of Kenalog first

## 2017-02-13 MED ORDER — TRIAMCINOLONE ACETONIDE 0.147 MG/GM EX AERS: INHALATION_SPRAY | Freq: Two times a day (BID) | CUTANEOUS | 0 refills | Status: DC

## 2017-02-13 NOTE — Telephone Encounter (Signed)
Please notify patient of what the insurance is stating.  She may want to follow back up with the dermatologist for additional samples. If she doesn't want to go back to dermatology then what else has she tried for the psoriasis?

## 2017-02-13 NOTE — Telephone Encounter (Signed)
Noted.  Prescription sent to pharmacy. 

## 2017-02-13 NOTE — Telephone Encounter (Signed)
Spoken and notified patient of Kate's comments. Patient verbalized understanding.  Patient stated that she does not want to go back to the dermatologist.  She have only tried OTC before. Patient is willing to try Kenalog and see.

## 2017-02-17 ENCOUNTER — Encounter: Payer: Self-pay | Admitting: Family Medicine

## 2017-02-17 ENCOUNTER — Ambulatory Visit (INDEPENDENT_AMBULATORY_CARE_PROVIDER_SITE_OTHER): Payer: 59 | Admitting: Family Medicine

## 2017-02-17 VITALS — BP 128/90 | HR 83 | Temp 99.8°F | Resp 18 | Ht 62.0 in | Wt 254.0 lb

## 2017-02-17 DIAGNOSIS — J029 Acute pharyngitis, unspecified: Secondary | ICD-10-CM | POA: Diagnosis not present

## 2017-02-17 DIAGNOSIS — R6889 Other general symptoms and signs: Secondary | ICD-10-CM | POA: Diagnosis not present

## 2017-02-17 DIAGNOSIS — M791 Myalgia, unspecified site: Secondary | ICD-10-CM | POA: Diagnosis not present

## 2017-02-17 DIAGNOSIS — R05 Cough: Secondary | ICD-10-CM | POA: Diagnosis not present

## 2017-02-17 MED ORDER — BENZONATATE 100 MG PO CAPS: 100.0000 mg | ORAL_CAPSULE | Freq: Three times a day (TID) | ORAL | 0 refills | Status: DC | PRN

## 2017-02-17 MED ORDER — OSELTAMIVIR PHOSPHATE 75 MG PO CAPS: 75.0000 mg | ORAL_CAPSULE | Freq: Two times a day (BID) | ORAL | 0 refills | Status: DC

## 2017-02-17 NOTE — Progress Notes (Signed)
Pre visit review using our clinic review tool, if applicable. No additional management support is needed unless otherwise documented below in the visit note. 

## 2017-02-17 NOTE — Patient Instructions (Signed)
Continue to push fluids, practice good hand hygiene, and cover your mouth if you cough.  Ibuprofen 400-600 mg (2-3 over the counter strength tabs) every 6 hours as needed for pain.  OK to take Tylenol 1000 mg (2 extra strength tabs) or 975 mg (3 regular strength tabs) every 6 hours as needed.

## 2017-02-17 NOTE — Progress Notes (Signed)
Chief Complaint  Patient presents with  . Cough    sore throat, mucus production- yellow, headaches, body aches, not sleeping started yesterday     Erika Henry here for URI complaints.  Duration: 2 days  Associated symptoms: low grade fevers, cough, runny nose, headaches, sore throat and myalgia Denies: sinus pain, itchy watery eyes, ear pain, ear drainage, shortness of breath and rigors Treatment to date: Mucinex Sick contacts: No  ROS:  Const: +fevers HEENT: As noted in HPI Lungs: No SOB  Past Medical History:  Diagnosis Date  . GERD (gastroesophageal reflux disease)   . Hypothyroid   . Migraines   . PCOS (polycystic ovarian syndrome)    Family History  Problem Relation Age of Onset  . Hyperlipidemia Mother   . Hypertension Mother   . Hyperlipidemia Father   . Hypertension Father   . Depression Brother   . Depression Maternal Grandmother   . Diabetes Maternal Grandmother   . Stroke Maternal Grandmother   . COPD Maternal Grandfather   . Diabetes Maternal Grandfather   . Stroke Maternal Grandfather   . Depression Paternal Grandmother   . Breast cancer Paternal Grandmother   . Arthritis Paternal Grandfather   . Cancer Paternal Grandfather   . Heart disease Paternal Grandfather   . Hyperlipidemia Paternal Grandfather   . Hypertension Paternal Grandfather   . Kidney disease Paternal Grandfather     BP 128/90 (BP Location: Left Arm, Patient Position: Sitting, Cuff Size: Large)   Pulse 83   Temp 99.8 F (37.7 C) (Oral)   Resp 18   Ht 5\' 2"  (1.575 m)   Wt 254 lb (115.2 kg)   LMP 01/24/2017 (Approximate)   SpO2 98%   BMI 46.46 kg/m  General: Awake, alert, appears stated age HEENT: AT, [place], ears patent b/l and TM's neg, nares patent w/o discharge, pharynx pink and without exudates, MMM Neck: No masses or asymmetry Heart: RRR, no murmurs, no bruits Lungs: CTAB, no accessory muscle use Psych: Age appropriate judgment and insight, normal mood and  affect  Flu-like symptoms  Tamiflu. Tylenol. NSAIDs. Push fluids, practice good hand hygiene, cover mouth when coughing. F/u prn.  Pt voiced understanding and agreement to the plan.  [name], DO 02/17/17 12:28 PM

## 2017-02-19 ENCOUNTER — Encounter: Payer: Self-pay | Admitting: Physician Assistant

## 2017-02-19 ENCOUNTER — Ambulatory Visit: Payer: Self-pay | Admitting: Physician Assistant

## 2017-02-19 VITALS — BP 132/100 | HR 108 | Temp 98.4°F

## 2017-02-19 DIAGNOSIS — B9789 Other viral agents as the cause of diseases classified elsewhere: Principal | ICD-10-CM

## 2017-02-19 DIAGNOSIS — J069 Acute upper respiratory infection, unspecified: Secondary | ICD-10-CM

## 2017-02-19 MED ORDER — PSEUDOEPHEDRINE HCL ER 120 MG PO TB12: 120.0000 mg | ORAL_TABLET | Freq: Two times a day (BID) | ORAL | 0 refills | Status: DC

## 2017-02-19 MED ORDER — MAGIC MOUTHWASH W/LIDOCAINE: 5.0000 mL | Freq: Four times a day (QID) | ORAL | 0 refills | Status: DC

## 2017-02-19 MED ORDER — HYDROCOD POLST-CPM POLST ER 10-8 MG/5ML PO SUER: 5.0000 mL | Freq: Every evening | ORAL | 0 refills | Status: DC | PRN

## 2017-02-19 NOTE — Progress Notes (Signed)
   Subjective: Viral illness     Patient ID: Erika Henry, female    DOB: 07-Oct-1980, 36 y.o.   MRN: [id number]  HPI Patient complaining of 4 days of sinus congestion, sore throat, cough, fever, and body aches. Patient states she was seen at urgent care clinic and diagnosed with the flu. Patient states she has taken a flu shot earlier this season. Patient states she was prescribed Tamiflu and Tessalon. Patient stated no improvement of her symptoms.    Review of Systems Hypothyroidism    Objective:   Physical Exam HEENT was remarkable for bilateral maxillary sinus guarding. Edematous nasal terms clear rhinorrhea. Patient has postnasal drainage. Erythematous pharynx. Patient is supple without adenopathy. Lungs clear to auscultation with a nonproductive cough. Heart is regular rate and rhythm.       Assessment & Plan: Viral upper respiratory infection   Patient advised to start taking Tessalon Perles. Patient given a prescription for Sudafed, Magic mouthwash, and Tussionex. Patient advised follow-up PCP if no improvement 3-5 days. Patient given a work note for 2 days.

## 2017-02-22 ENCOUNTER — Telehealth: Payer: 59 | Admitting: Physician Assistant

## 2017-02-22 DIAGNOSIS — R0602 Shortness of breath: Secondary | ICD-10-CM

## 2017-02-22 NOTE — Progress Notes (Signed)
Based on what you shared with me it looks like you have a condition that should be evaluated in a face to face office visit. I am concerned that you are developing/have developed a pneumonia with bronchospasm. You need a repeat lung examination and likely chest x-ray to make an accurate diagnosis and so the proper medications can be given. If you are noting labored breathing and the lower oxygen saturation, I would recommend assessment tonight NOTE: Even if you have entered your credit card information for this eVisit, you will not be charged.   If you are having a true medical emergency please call 911.  If you need an urgent face to face visit, [place] has four urgent care centers for your convenience.  If you need care fast and have a high deductible or no insurance consider:   [email or web address]  [phone]  [address] [place], [place] [address] 8 am to 8 pm Monday-Friday 10 am to 4 pm Saturday-Sunday   The following sites will take your  insurance:    . [facility] Urgent [place] a Provider at this Location  [address] [place], [place] [address] . 10 am to 8 pm Monday-Friday . 12 pm to 8 pm Saturday-Sunday   . [facility] at [place] a Provider at this Location  [address] [place], [place], [place] [address] . 8 am to 8 pm Monday-Friday . 9 am to 6 pm Saturday . 11 am to 6 pm Sunday   . [facility] at [place] Get Driving Directions  [address] [place], [place] [address] . 8 am to 8 pm Monday-Friday . 8 am to 4 pm Saturday-Sunday   Your e-visit answers were reviewed by a board certified advanced clinical practitioner to complete your personal care plan.  Thank you for using e-Visits.

## 2017-03-08 ENCOUNTER — Telehealth: Payer: 59 | Admitting: Physician Assistant

## 2017-03-08 DIAGNOSIS — J069 Acute upper respiratory infection, unspecified: Secondary | ICD-10-CM

## 2017-03-08 NOTE — Progress Notes (Signed)
Based on what you shared with me it looks like you have a condition that should be evaluated in a face to face office visit. Giving your symptoms this could be a post-viral cough, reflux related cough or cough secondary to drainage. Giving that you mention you are not coughing up much mucous and that what you cough up is clear, this does not seem bacterial. You need a physical examination so the right diagnosis can be made and proper treatment can be given. We are only authorized to give the [employer] via e-visit. A codeine cough syrup may likely help the cough symptoms but this has to be written in office since it contains a controlled substance.  NOTE: Even if you have entered your credit card information for this eVisit, you will not be charged.   If you are having a true medical emergency please call 911.  If you need an urgent face to face visit, [place] has four urgent care centers for your convenience.  If you need care fast and have a high deductible or no insurance consider:   [email or web address]  [phone]  [address] [place], [place] [address] 8 am to 8 pm Monday-Friday 10 am to 4 pm Saturday-Sunday   The following sites will take your  insurance:    . [facility] Health Urgent [place] a Provider at this Location  [address] [place], [place] [address] . 10 am to 8 pm Monday-Friday . 12 pm to 8 pm Saturday-Sunday   . [facility] Urgent Care at [place] a Provider at this Location  [address], [place], [place] [address] . 8 am to 8 pm Monday-Friday . 9 am to 6 pm Saturday . 11 am to 6 pm Sunday   . [facility] Urgent Care at [place] Get Driving Directions  [address] [place], [place] [address] . 8 am to 8 pm Monday-Friday . 8 am to 4 pm Saturday-Sunday   Your e-visit  answers were reviewed by a board certified advanced clinical practitioner to complete your personal care plan.  Thank you for using e-Visits.

## 2017-03-09 ENCOUNTER — Ambulatory Visit: Payer: Self-pay | Admitting: Emergency Medicine

## 2017-03-09 VITALS — BP 158/90 | HR 81 | Temp 98.6°F

## 2017-03-09 DIAGNOSIS — J209 Acute bronchitis, unspecified: Secondary | ICD-10-CM

## 2017-03-09 MED ORDER — PREDNISONE 10 MG PO TABS: ORAL_TABLET | ORAL | 0 refills | Status: DC

## 2017-03-09 MED ORDER — PSEUDOEPH-BROMPHEN-DM 30-2-10 MG/5ML PO SYRP: 5.0000 mL | ORAL_SOLUTION | Freq: Four times a day (QID) | ORAL | 0 refills | Status: DC | PRN

## 2017-03-09 NOTE — Progress Notes (Signed)
S: here with  complaint of sore throat, persistent cough, and congestion for the last 3 weeks.  Patient denies any fever.  She is only used cough drops for her symptoms.  She does not take any Tylenol or ibuprofen.  Patient is a non-smoker. O: TMs are dull bilaterally.  Nasal mucosa slightly boggy.  Clear pharynx moderate injection seen.  Neck is supple without adenopathy.  Lungs are clear to wheezes or rales.  Patient has a very congested cough that sounds bronchitic.  Heart regular rate and rhythm.  Nontender sinuses to percussion. A: Upper respiratory infection with cough/acute bronchitis. P: Patient will start prednisone 30 mg once daily for the next 4 days.  She is also given a prescription for Bromfed-DM 4 times daily as needed for cough and congestion.  She is to increase fluids.  She will return if any continued symptoms or fever.

## 2017-05-03 ENCOUNTER — Encounter: Payer: Self-pay | Admitting: Primary Care

## 2017-05-03 DIAGNOSIS — Z6841 Body Mass Index (BMI) 40.0 and over, adult: Secondary | ICD-10-CM

## 2017-05-11 ENCOUNTER — Encounter: Payer: 59 | Attending: Primary Care | Admitting: Dietician

## 2017-05-11 ENCOUNTER — Encounter: Payer: Self-pay | Admitting: Dietician

## 2017-05-11 VITALS — Ht 62.0 in | Wt 260.8 lb

## 2017-05-11 DIAGNOSIS — E669 Obesity, unspecified: Secondary | ICD-10-CM | POA: Diagnosis not present

## 2017-05-11 DIAGNOSIS — Z6841 Body Mass Index (BMI) 40.0 and over, adult: Secondary | ICD-10-CM | POA: Diagnosis not present

## 2017-05-11 NOTE — Patient Instructions (Addendum)
   GERD/ acid reflux: limit tomatoes, acidic foods, chocolate, caffeine, fried food, greasy food, high-fat food, spicy  Work on a plan to help make meal planning a part of your week (make it a habit!), have some quick and healthy recipes planned out or that are your "go-to" to make in a pinch  Continue to work on drinking more water throughout the day, carry a water bottle with you or flavor it with non-caloric sweetener (Cyrst[employer] Light) or fruit/lemon  Sources of healthy fats: olives and olive oil, salmon, tuna, nuts and nut butter, chia seed, flaxseed, sardines, capers  EatingWell, Cooking Light  Look for lower fat dairy products. As as rule of thumb, try to choose foods that have single digits for fat and sugar per serving when reading labels

## 2017-05-11 NOTE — Progress Notes (Signed)
Medical Nutrition Therapy: Visit start time: 1300  end time: 1400  Assessment:  Diagnosis: obesity Past medical history: Hypothyroidism, PCOS, GERD Psychosocial issues/ stress concerns: none Preferred learning method:  . Auditory . Visual . Hands-on  Current weight: 260.8lb  Height: 5\' 2"  Medications, supplements: MVI, Vitamin D3, Synthroid, Prilosec prn, see chart  Progress and evaluation: Pt is interested in learning how to lose weight through proper nutrition as she has not been able to see results through exercise alone. She would also like to learn how to plan out daily/ weekly meals more efficiently. Her husband was recently dx with type II DM and together they have started to make changes to their eating habits. For example, choosing less pasta-centered dishes when eating out and instead opting for a meal with a protein like chicken + vegetables, avoiding most fried foods, drinking more water, opting for SF varieties of beverages and snacks/ sweets, reducing portions of starches at meals, choosing diet over regular soda, cooking a greater number of meals at home, not consuming pre-packaged meals, and buying more whole grain products. States she struggles to plan meals in advance and is in search of more variety for recipes. Finds lunch meals to be most challenging. Due to this challenge she has started to go out to eat more often again. She is also struggling to drink more water, describes filling up water bottles but not always remembering to drink them. Currently she feels as though she may not be eating enough protein.  Physical activity: Group circuit training class 2-3x/wk for 60 min each. She has done these classes consistently for just over 1 year.  Dietary Intake:  Usual eating pattern includes 3 meals and 0-2 snacks per day. Dining out frequency: 8 meals per week.  Breakfast: Atkins shake or Special K Original cereal with 2% milk + banana Snack: not usually Lunch: chicken wraps  + veggie and dip + apple slices, if out: beef + potatoes+ cabbage Snack: Atkins shake occasionally before workouts Supper: instant pot soups and stews, chicken + two vegetables, roast, pork loin, meatloaf, [employer] meals, beans, Hibachi chicken or other chicken-based meal when out to eat Snack: [employer] ice cream bar, LF yogurt Beverages: 1 cup coffee with full-fat creamer, unsweetened iced tea, diet soda, water  Nutrition Care Education: Topics covered: individualized guidelines for calories / protein/ carbohydrates and fat each day, sugar sweetened beverages, strategies for healthful eating when eating out, ways to reduce fat, sugar and total calories, planning meals, hydration, portion sizes and control Basic nutrition: basic food groups, appropriate nutrient balance, appropriate meal and snack schedule, general nutrition guidelines    Weight control: benefits of weight control, behavioral changes for weight loss, healthy rate of weight loss Advanced nutrition: cooking techniques, dining out, food label reading Other lifestyle changes:  benefits of making changes, increasing motivation, readiness for change, identifying habits that need to change  Nutritional Diagnosis:  [place]-1.1 Food and nutrition-related knowledge deficit As related to lack of prior education.  As evidenced by patient's inability to name different foods from each food group or knowing how much food she should eat from each food group each day. [place]-3.3 Overweight/obesity As related to lifestyle habits.  As evidenced by BMI 47.7.  Intervention: Discussion as noted above. Out of the list of goals established today, patient would first like to work on drinking more water throughout the day and preparing/eating meals at home more often. She and her husband will continue to make changes to their diets together.  Education Materials given:  . General diet guidelines for losing weight . Quick and Healthy Meals . Goals/ instructions  Learner/  who was taught:  . Patient  Level of understanding: . Partial understanding; needs review/ practice  Demonstrated degree of understanding via:   Teach back Learning barriers: . None  Willingness to learn/ readiness for change: . Eager, change in progress  Monitoring and Evaluation:  Dietary intake, exercise, and body weight      follow up: in 4 week(s): March 13th

## 2017-05-17 ENCOUNTER — Encounter: Payer: Self-pay | Admitting: Primary Care

## 2017-05-21 ENCOUNTER — Encounter: Payer: Self-pay | Admitting: Primary Care

## 2017-05-21 DIAGNOSIS — E282 Polycystic ovarian syndrome: Secondary | ICD-10-CM

## 2017-05-21 DIAGNOSIS — E039 Hypothyroidism, unspecified: Secondary | ICD-10-CM

## 2017-06-06 ENCOUNTER — Ambulatory Visit: Payer: Self-pay | Admitting: Dietician

## 2017-06-20 ENCOUNTER — Encounter: Payer: 59 | Attending: Primary Care | Admitting: Dietician

## 2017-06-20 VITALS — Wt 261.7 lb

## 2017-06-20 DIAGNOSIS — Z6841 Body Mass Index (BMI) 40.0 and over, adult: Secondary | ICD-10-CM | POA: Diagnosis not present

## 2017-06-20 DIAGNOSIS — E669 Obesity, unspecified: Secondary | ICD-10-CM | POA: Diagnosis not present

## 2017-06-20 NOTE — Progress Notes (Signed)
Medical Nutrition Therapy: Visit start time: 1630  end time: 1730  Assessment:  Diagnosis: obesity Medical history changes: none Psychosocial issues/ stress concerns: none  Current weight: 261.7lb  Height: 5\' 2"  Medications, supplement changes: none  Progress and evaluation: Patient reports minimal adherence to previous nutrition intervention recommendations. She has been trying not to eat out as frequently but still struggles with dinner meals, as she feels tired when she gets home from work and/or the gym and does not want to spend time cooking. C/o feeling hungry during her workouts d/t not knowing what to eat beforehand and feeling sick if she eats within 2 hours of the workout. She has her husband have been going to [employer] for fast food after their evening workouts recently. She is considering ordering a meal-prep service such as Hello Fresh to help her be more adherent to the goal to eat at home more, as she has been unable to carve out time to meal-prep on her own during the week. Continues to struggle with daily fluid intake though does bring a water bottle with her to work. However, she has been reading nutrition facts labels more consistently to identify lower sugar/ fat items, and has started to identify how certain foods make her feel in terms of energy and stomach upset. Reports trialing a greens product and a vegan protein powder from the brand "It Works," but disliked the tastes and no longer consumes them.  Physical activity: Continues to attend the group circuit training class 2 days/week at the gym with her husband  Dietary Intake:  Usual eating pattern includes 3 meals and 0-2 snacks per day. Dining out frequency: 8 meals per week.   Nutrition Care Education: Topics covered: *Fast FoodsWeight management guidelineslow fat, discussion of pros and cons of meal-prep companies such as Hello Fresh, finding "staple" recipes that can be prepared quickly, how to stock your fridge pantry  and freezer with ingredients for quick and nutritious meals, mindful eating, PCOS and hypothyroid nutrition therapy Basic nutrition: basic food groups, appropriate nutrient balance, appropriate meal and snack schedule, general nutrition guidelines    Weight control: behavioral changes for weight loss Advanced nutrition: dining out, food label reading Hyperlipidemia (husband diagnosed): healthy and unhealthy fats, role of fiber, foods containing cholesterol, daily cholesterol guidelines Other lifestyle changes:  benefits of making changes, increasing motivation, readiness for change, identifying habits that need to change  Nutritional Diagnosis:  [place]-3.3 Overweight/obesity As related to lifestyle habits and dx of PCOS.  As evidenced by BMI 47.87.  Intervention: Discussion as noted above. Patient will work on [employer], and continue to try to eat out less and at home more. She will also continue to drink more water throughout the day and to identify how she feels after eating certain foods.  Education Materials given:  [name] Kitchen Quick and Nutritious Meals for the Family . Three Steps to Quick and [employer] . Goals/ instructions  Learner/ who was taught:  . Patient   Level of understanding: [name] Kitchen Verbalizes/ demonstrates competency  Demonstrated degree of understanding via:   Teach back Learning barriers: [name] Kitchen Motivation lacking  Willingness to learn/ readiness for change: . Eager, change in progress  Monitoring and Evaluation:  Dietary intake, exercise, and body weight      follow up: in 4 week(s): July 18, 2017

## 2017-06-22 ENCOUNTER — Encounter: Payer: Self-pay | Admitting: Dietician

## 2017-06-25 DIAGNOSIS — H52223 Regular astigmatism, bilateral: Secondary | ICD-10-CM | POA: Diagnosis not present

## 2017-06-25 DIAGNOSIS — H5213 Myopia, bilateral: Secondary | ICD-10-CM | POA: Diagnosis not present

## 2017-06-26 ENCOUNTER — Encounter: Payer: Self-pay | Admitting: Primary Care

## 2017-06-26 NOTE — Telephone Encounter (Signed)
Erika Henry or Erika Henry, See my chart message. Can either of you help?

## 2017-06-28 DIAGNOSIS — K219 Gastro-esophageal reflux disease without esophagitis: Secondary | ICD-10-CM | POA: Diagnosis not present

## 2017-06-28 DIAGNOSIS — E063 Autoimmune thyroiditis: Secondary | ICD-10-CM | POA: Diagnosis not present

## 2017-06-28 DIAGNOSIS — E039 Hypothyroidism, unspecified: Secondary | ICD-10-CM | POA: Diagnosis not present

## 2017-06-28 DIAGNOSIS — E669 Obesity, unspecified: Secondary | ICD-10-CM | POA: Diagnosis not present

## 2017-06-28 DIAGNOSIS — E282 Polycystic ovarian syndrome: Secondary | ICD-10-CM | POA: Diagnosis not present

## 2017-07-05 DIAGNOSIS — E119 Type 2 diabetes mellitus without complications: Secondary | ICD-10-CM | POA: Diagnosis not present

## 2017-07-05 DIAGNOSIS — K219 Gastro-esophageal reflux disease without esophagitis: Secondary | ICD-10-CM | POA: Diagnosis not present

## 2017-07-05 DIAGNOSIS — E669 Obesity, unspecified: Secondary | ICD-10-CM | POA: Diagnosis not present

## 2017-07-05 DIAGNOSIS — E063 Autoimmune thyroiditis: Secondary | ICD-10-CM | POA: Diagnosis not present

## 2017-07-05 DIAGNOSIS — E039 Hypothyroidism, unspecified: Secondary | ICD-10-CM | POA: Diagnosis not present

## 2017-07-05 DIAGNOSIS — Z6841 Body Mass Index (BMI) 40.0 and over, adult: Secondary | ICD-10-CM | POA: Diagnosis not present

## 2017-07-05 DIAGNOSIS — E282 Polycystic ovarian syndrome: Secondary | ICD-10-CM | POA: Diagnosis not present

## 2017-07-18 ENCOUNTER — Encounter: Payer: 59 | Attending: Primary Care | Admitting: Dietician

## 2017-07-18 VITALS — Wt 258.3 lb

## 2017-07-18 DIAGNOSIS — E669 Obesity, unspecified: Secondary | ICD-10-CM | POA: Insufficient documentation

## 2017-07-18 DIAGNOSIS — Z6841 Body Mass Index (BMI) 40.0 and over, adult: Secondary | ICD-10-CM | POA: Insufficient documentation

## 2017-07-18 NOTE — Progress Notes (Signed)
Medical Nutrition Therapy: Visit start time: 8676  end time: 1730 Assessment:  Diagnosis: obesity Medical history changes: none Psychosocial issues/ stress concerns: none  Current weight: 258.3#  Height: 5\' 2"  Medications, supplement changes: thyroid medication was switched from Synthroid to Armour Thyroid  Progress and evaluation: Patient followed through with ordering Hello Fresh meal delivery service and she has her husband have enjoyed preparing them together. They are eating out less as a result. At meal times she has been focusing on eating smaller portions and on being more mindful about the types of foods she is consuming in terms of how they make her feel. For example, she noticed that after having pizza she felt "bad" and so has not been eating it as often. She has also been trying to choose foods that "stay with her" longer and promote greater satiety. She has increased her water intake during the work week but continues to struggle with drinking fluids on the weekends. Per our discussion on peri-workout nutrition, she tried to eat almonds before a workout but did not like they way they made her feel while exercising and has gone back to no pre-workout snack. Her husband was recently dx with DM type II and she is attending DM classes with him. They are considering choosing more meatless / plant-based meals as a way to further improve their diet.   Physical activity: Group classes 2 days / week with her husband, planning to increase to 3 days/ week. Considering adding walking into her weekly routine now that the weather has improved.  Dietary Intake:  Usual eating pattern includes 3 meals and 0-2 snacks per day. Dining out frequency: 3-4 meals per week.   Nutrition Care Education: Topics covered: Field seismologist (Fat)Weight management guidelinesgeneral or low fat, types of fats, omega-3 fatty acids and their dietary sources, anti-inflammatory diets, Mediterranean diet due to autoimmune  disorders, how to choose a good fish oil supplement, planning meals for work the night before, preparing diabetic-friendly meals d/t husband's new dx, planning meatless / "plant based" meals, mindful eating, meatless protein source brand suggestions Basic nutrition: basic food groups, appropriate nutrient balance, appropriate meal and snack schedule, general nutrition guidelines    Weight control: benefits of weight control, behavioral changes for weight loss Advanced nutrition: cooking techniques, dining out, food label reading Other lifestyle changes:  benefits of making changes, increasing motivation, readiness for change, identifying habits that need to change  Nutritional Diagnosis:  [place]-3.3 Overweight/obesity As related to previous lifestyle habits.  As evidenced by BMI 47.24.  Intervention: Discussion as noted above. Patient will continue to work towards planning more meals at home and being more mindful about the foods she is consuming in terms of how they make her feel and the portions she chooses to eat. She will also continue to drink more water throughout the day, especially on weekends when it is more difficult, to maintain or increase her exercise routine. She and her husband are working to make change together, especially after his dx of DM.   Education Materials given:  [name] Kitchen Goals/ instructions; patient wrote down a list of tips and goals in her notebook  Learner/ who was taught:  . Patient   Level of understanding: [name] Kitchen Verbalizes/ demonstrates competency  Demonstrated degree of understanding via:   Teach back Learning barriers: . None  Willingness to learn/ readiness for change: . Eager, change in progress  Monitoring and Evaluation:  Dietary intake, exercise, and body weight      follow up: prn:  to call insurance to see about coverage for additional appointments

## 2017-07-20 ENCOUNTER — Encounter: Payer: Self-pay | Admitting: Dietician

## 2017-07-27 DIAGNOSIS — E063 Autoimmune thyroiditis: Secondary | ICD-10-CM | POA: Diagnosis not present

## 2017-07-27 DIAGNOSIS — E282 Polycystic ovarian syndrome: Secondary | ICD-10-CM | POA: Diagnosis not present

## 2017-07-27 DIAGNOSIS — E669 Obesity, unspecified: Secondary | ICD-10-CM | POA: Diagnosis not present

## 2017-08-03 DIAGNOSIS — E669 Obesity, unspecified: Secondary | ICD-10-CM | POA: Diagnosis not present

## 2017-08-03 DIAGNOSIS — E282 Polycystic ovarian syndrome: Secondary | ICD-10-CM | POA: Diagnosis not present

## 2017-08-03 DIAGNOSIS — E063 Autoimmune thyroiditis: Secondary | ICD-10-CM | POA: Diagnosis not present

## 2017-08-03 DIAGNOSIS — Z6841 Body Mass Index (BMI) 40.0 and over, adult: Secondary | ICD-10-CM | POA: Diagnosis not present

## 2017-08-03 DIAGNOSIS — K219 Gastro-esophageal reflux disease without esophagitis: Secondary | ICD-10-CM | POA: Diagnosis not present

## 2017-08-03 DIAGNOSIS — E039 Hypothyroidism, unspecified: Secondary | ICD-10-CM | POA: Diagnosis not present

## 2017-09-05 ENCOUNTER — Telehealth: Payer: Self-pay | Admitting: Primary Care

## 2017-09-05 NOTE — Telephone Encounter (Signed)
See below crm Ok to schedule?  Copied from [place] [id number]. Topic: Appointment Scheduling - Scheduling Inquiry for Clinic >> Sep 04, 2017  4:50 PM Valla Leaver wrote: Reason for CRM: Patient wants to know if she can be worked in on 06/19 at 9:45am for CPE w/ Allie Bossier or another day before July 1st for her work insurance requirements? Please call patient back to notify if she can be work in.

## 2017-09-05 NOTE — Telephone Encounter (Signed)
Yes, okay to schedule at her convenience.

## 2017-09-05 NOTE — Telephone Encounter (Signed)
Pt aware of appointment 

## 2017-09-12 ENCOUNTER — Encounter: Payer: 59 | Admitting: Primary Care

## 2017-10-01 DIAGNOSIS — E063 Autoimmune thyroiditis: Secondary | ICD-10-CM | POA: Diagnosis not present

## 2017-10-01 DIAGNOSIS — E282 Polycystic ovarian syndrome: Secondary | ICD-10-CM | POA: Diagnosis not present

## 2017-10-01 DIAGNOSIS — E039 Hypothyroidism, unspecified: Secondary | ICD-10-CM | POA: Diagnosis not present

## 2017-10-01 DIAGNOSIS — E669 Obesity, unspecified: Secondary | ICD-10-CM | POA: Diagnosis not present

## 2017-10-09 ENCOUNTER — Ambulatory Visit (INDEPENDENT_AMBULATORY_CARE_PROVIDER_SITE_OTHER): Payer: 59 | Admitting: Obstetrics and Gynecology

## 2017-10-09 ENCOUNTER — Other Ambulatory Visit (HOSPITAL_COMMUNITY)
Admission: RE | Admit: 2017-10-09 | Discharge: 2017-10-09 | Disposition: A | Discharge status: Home / Self Care | Payer: 59 | Source: Ambulatory Visit | Attending: Obstetrics and Gynecology | Admitting: Obstetrics and Gynecology

## 2017-10-09 ENCOUNTER — Encounter: Payer: Self-pay | Admitting: Obstetrics and Gynecology

## 2017-10-09 VITALS — BP 114/72 | HR 85 | Ht 62.0 in | Wt 259.0 lb

## 2017-10-09 DIAGNOSIS — Z3202 Encounter for pregnancy test, result negative: Secondary | ICD-10-CM

## 2017-10-09 DIAGNOSIS — Z803 Family history of malignant neoplasm of breast: Secondary | ICD-10-CM | POA: Diagnosis not present

## 2017-10-09 DIAGNOSIS — Z124 Encounter for screening for malignant neoplasm of cervix: Secondary | ICD-10-CM

## 2017-10-09 DIAGNOSIS — E282 Polycystic ovarian syndrome: Secondary | ICD-10-CM

## 2017-10-09 DIAGNOSIS — Z01411 Encounter for gynecological examination (general) (routine) with abnormal findings: Secondary | ICD-10-CM

## 2017-10-09 DIAGNOSIS — Z1151 Encounter for screening for human papillomavirus (HPV): Secondary | ICD-10-CM | POA: Insufficient documentation

## 2017-10-09 DIAGNOSIS — N926 Irregular menstruation, unspecified: Secondary | ICD-10-CM

## 2017-10-09 DIAGNOSIS — Z01419 Encounter for gynecological examination (general) (routine) without abnormal findings: Secondary | ICD-10-CM

## 2017-10-09 LAB — POCT URINE PREGNANCY: Preg Test, Ur: NEGATIVE

## 2017-10-09 NOTE — Patient Instructions (Signed)
I value your feedback and entrusting us with your care. If you get a [facility] patient survey, I would appreciate you taking the time to let us know about your experience today. Thank you! 

## 2017-10-09 NOTE — Progress Notes (Signed)
PCP:  Pleas Koch, NP   Chief Complaint  Patient presents with  . Gynecologic Exam     HPI:      [name] is a 37 y.o. No obstetric history on file. who LMP was Patient's last menstrual period was 07/25/2017 (approximate)., presents today for her NP annual examination.  Her menses are Q1-3 months, lasting 7-10 days, med flow.  Dysmenorrhea mild, occurring first 1-2 days of flow. She does not usually have intermenstrual bleeding, but has had it in distant past. Hx of PCOS. Takes metformin, spironolactone. OCPs in past raised BP. Hx of leio per pt (u/s done by Dr. Merrie Roof in 2015 was neg for [facility]).   Sex activity: single partner, contraception - none. Conception in near future ok. Trying to lose wt first.  Last Pap: not recent Hx of STDs: HPV  There is a FH of breast cancer in her PGM, genetic testing not indicated. There is no FH of ovarian cancer. The patient does do self-breast exams.  Tobacco use: The patient denies current or previous tobacco use. Alcohol use: none No drug use.  Exercise: very active  She does get adequate calcium and Vitamin D in her diet. Labs with PCP.   Past Medical History:  Diagnosis Date  . GERD (gastroesophageal reflux disease)   . Hypothyroid   . Lichen sclerosus   . Migraines   . PCOS (polycystic ovarian syndrome)   . Vaccine for human papilloma virus (HPV) types 6, 11, 16, and 18 administered     Past Surgical History:  Procedure Laterality Date  . COLPOSCOPY  2004  . WISDOM TOOTH EXTRACTION  2005    Family History  Problem Relation Age of Onset  . Hyperlipidemia Mother   . Hypertension Mother   . Hyperlipidemia Father   . Hypertension Father   . Depression Brother   . Depression Maternal Grandmother   . Diabetes Maternal Grandmother   . Stroke Maternal Grandmother   . COPD Maternal Grandfather   . Diabetes Maternal Grandfather   . Stroke Maternal Grandfather   . Depression Paternal Grandmother   . Breast  cancer Paternal Grandmother 30  . Arthritis Paternal Grandfather   . Cancer Paternal Grandfather   . Heart disease Paternal Grandfather   . Hyperlipidemia Paternal Grandfather   . Hypertension Paternal Grandfather   . Kidney disease Paternal Grandfather     Social History   Socioeconomic History  . Marital status: Married    Spouse name: Not on file  . Number of children: Not on file  . Years of education: Not on file  . Highest education level: Not on file  Occupational History  . Not on file  Social Needs  . Financial resource strain: Not on file  . Food insecurity:    Worry: Not on file    Inability: Not on file  . Transportation needs:    Medical: Not on file    Non-medical: Not on file  Tobacco Use  . Smoking status: Never Smoker  . Smokeless tobacco: Never Used  Substance and Sexual Activity  . Alcohol use: Yes    Alcohol/week: 0.6 - 1.2 oz    Types: 1 - 2 Glasses of wine per week    Comment: occasionally once a month  . Drug use: Not Currently  . Sexual activity: Yes  Lifestyle  . Physical activity:    Days per week: Not on file    Minutes per session: Not on file  .  Stress: Not on file  Relationships  . Social connections:    Talks on phone: Not on file    Gets together: Not on file    Attends religious service: Not on file    Active member of club or organization: Not on file    Attends meetings of clubs or organizations: Not on file    Relationship status: Not on file  . Intimate partner violence:    Fear of current or ex partner: Not on file    Emotionally abused: Not on file    Physically abused: Not on file    Forced sexual activity: Not on file  Other Topics Concern  . Not on file  Social History Narrative   Married.   No children.   Works as a Technical brewer at [employer].   Enjoys exercising, shopping, bowling, traveling.    Outpatient Medications Prior to Visit  Medication Sig Dispense Refill  . ARMOUR THYROID 90 MG tablet   2  .  metFORMIN (GLUCOPHAGE-XR) 500 MG 24 hr tablet Take 500 mg by mouth 2 (two) times daily.   2  . Multiple Vitamin (MULTI-VITAMINS) TABS Take by mouth.    [name] Kitchen omeprazole (PRILOSEC) 20 MG capsule Take 1 capsule (20 mg total) daily as needed by mouth. 90 capsule 0  . spironolactone (ALDACTONE) 50 MG tablet Take by mouth daily.   2  . Cholecalciferol (VITAMIN D3) 2000 units capsule Take by mouth.    . triamcinolone (KENALOG) 0.147 MG/GM topical spray Apply topically 2 (two) times daily. (Patient not taking: Reported on 06/20/2017) 63 g 0  . benzonatate (TESSALON) 100 MG capsule Take 1 capsule (100 mg total) by mouth 3 (three) times daily as needed. (Patient not taking: Reported on 03/09/2017) 30 capsule 0  . Biotin 2500 MCG CAPS Take by mouth.    . brompheniramine-pseudoephedrine-DM 30-2-10 MG/5ML syrup Take 5 mLs by mouth 4 (four) times daily as needed. (Patient not taking: Reported on 05/11/2017) 120 mL 0  . chlorpheniramine-HYDROcodone (TUSSIONEX PENNKINETIC ER) 10-8 MG/5ML SUER Take 5 mLs by mouth at bedtime as needed for cough. (Patient not taking: Reported on 03/09/2017) 50 mL 0  . levothyroxine (SYNTHROID, LEVOTHROID) 88 MCG tablet Take 88 mcg by mouth daily before breakfast.    . magic mouthwash w/lidocaine SOLN Take 5 mLs by mouth 4 (four) times daily. (Patient not taking: Reported on 03/09/2017) 100 mL 0  . oseltamivir (TAMIFLU) 75 MG capsule Take 1 capsule (75 mg total) by mouth 2 (two) times daily. (Patient not taking: Reported on 03/09/2017) 10 capsule 0  . predniSONE (DELTASONE) 10 MG tablet Take 3 tablets once a day for 4 days (Patient not taking: Reported on 05/11/2017) 12 tablet 0  . pseudoephedrine (SUDAFED 12 HOUR) 120 MG 12 hr tablet Take 1 tablet (120 mg total) by mouth 2 (two) times daily. (Patient not taking: Reported on 03/09/2017) 20 tablet 0   No facility-administered medications prior to visit.     ROS:  Review of Systems  Constitutional: Negative for fatigue, fever and  unexpected weight change.  Respiratory: Negative for cough, shortness of breath and wheezing.   Cardiovascular: Negative for chest pain, palpitations and leg swelling.  Gastrointestinal: Negative for blood in stool, constipation, diarrhea, nausea and vomiting.  Endocrine: Negative for cold intolerance, heat intolerance and polyuria.  Genitourinary: Positive for menstrual problem. Negative for dyspareunia, dysuria, flank pain, frequency, genital sores, hematuria, pelvic pain, urgency, vaginal bleeding, vaginal discharge and vaginal pain.  Musculoskeletal: Negative for back pain,  joint swelling and myalgias.  Skin: Negative for rash.  Neurological: Negative for dizziness, syncope, light-headedness, numbness and headaches.  Hematological: Negative for adenopathy.  Psychiatric/Behavioral: Negative for agitation, confusion, sleep disturbance and suicidal ideas. The patient is not nervous/anxious.   BREAST: No symptoms   Objective: BP 114/72 (BP Location: Left Arm, Patient Position: Sitting, Cuff Size: Large)   Pulse 85   Ht 5\' 2"  (1.575 m)   Wt 259 lb (117.5 kg)   LMP 07/25/2017 (Approximate)   SpO2 98%   BMI 47.37 kg/m    Physical Exam  Constitutional: She is oriented to person, place, and time. She appears well-developed and well-nourished.  Genitourinary: Vagina normal and uterus normal. There is no rash or tenderness on the right labia. There is no rash or tenderness on the left labia. No erythema or tenderness in the vagina. No vaginal discharge found. Right adnexum does not display mass and does not display tenderness. Left adnexum does not display mass and does not display tenderness. Cervix does not exhibit motion tenderness or polyp. Uterus is not enlarged or tender.  Neck: Normal range of motion. No thyromegaly present.  Cardiovascular: Normal rate, regular rhythm and normal heart sounds.  No murmur heard. Pulmonary/Chest: Effort normal and breath sounds normal. Right breast  exhibits no mass, no nipple discharge, no skin change and no tenderness. Left breast exhibits no mass, no nipple discharge, no skin change and no tenderness.  Abdominal: Soft. There is no tenderness. There is no guarding.  Musculoskeletal: Normal range of motion.  Neurological: She is alert and oriented to person, place, and time. No cranial nerve deficit.  Psychiatric: She has a normal mood and affect. Her behavior is normal.  Vitals reviewed.   Results: Results for orders placed or performed in visit on 10/09/17 (from the past 24 hour(s))  POCT urine pregnancy     Status: None   Collection Time: 10/09/17  9:17 AM  Result Value Ref Range   Preg Test, Ur Negative Negative    Assessment/Plan: Encounter for annual routine gynecological examination - Plan: POCT urine pregnancy  Cervical cancer screening - Plan: Cytology - PAP  Screening for HPV (human papillomavirus) - Plan: Cytology - PAP  PCOS (polycystic ovarian syndrome) - Pt not intersted in [facility] currently. Discussed menses at least Q3 months ok. F/u if menses longer than that. Cont diet/exercise/wt loss.   Late menses - Neg UPT today. Call if no menses by 8/19 for provera Rx.             GYN counsel adequate intake of calcium and vitamin D, diet and exercise     F/U  Return in about 1 year (around 10/10/2018).  Alicia B. Copland, PA-C 10/09/2017 12:09 PM

## 2017-10-10 ENCOUNTER — Encounter: Payer: Self-pay | Admitting: Obstetrics and Gynecology

## 2017-10-10 LAB — CYTOLOGY - PAP
DIAGNOSIS: NEGATIVE
HPV: NOT DETECTED

## 2017-10-12 DIAGNOSIS — E063 Autoimmune thyroiditis: Secondary | ICD-10-CM | POA: Diagnosis not present

## 2017-10-12 DIAGNOSIS — E039 Hypothyroidism, unspecified: Secondary | ICD-10-CM | POA: Diagnosis not present

## 2017-10-12 DIAGNOSIS — Z6841 Body Mass Index (BMI) 40.0 and over, adult: Secondary | ICD-10-CM | POA: Diagnosis not present

## 2017-10-12 DIAGNOSIS — E282 Polycystic ovarian syndrome: Secondary | ICD-10-CM | POA: Diagnosis not present

## 2017-10-12 DIAGNOSIS — K219 Gastro-esophageal reflux disease without esophagitis: Secondary | ICD-10-CM | POA: Diagnosis not present

## 2017-10-12 DIAGNOSIS — E669 Obesity, unspecified: Secondary | ICD-10-CM | POA: Diagnosis not present

## 2017-10-13 ENCOUNTER — Telehealth: Payer: 59 | Admitting: Physician Assistant

## 2017-10-13 DIAGNOSIS — J019 Acute sinusitis, unspecified: Secondary | ICD-10-CM | POA: Diagnosis not present

## 2017-10-13 DIAGNOSIS — B9789 Other viral agents as the cause of diseases classified elsewhere: Secondary | ICD-10-CM

## 2017-10-13 MED ORDER — FLUTICASONE PROPIONATE 50 MCG/ACT NA SUSP: 2.0000 | Freq: Every day | NASAL | 6 refills | Status: DC

## 2017-10-13 NOTE — Progress Notes (Signed)

## 2017-10-29 ENCOUNTER — Encounter: Payer: 59 | Admitting: Primary Care

## 2017-12-03 ENCOUNTER — Encounter: Payer: 59 | Admitting: Primary Care

## 2017-12-07 DIAGNOSIS — E669 Obesity, unspecified: Secondary | ICD-10-CM | POA: Diagnosis not present

## 2017-12-07 DIAGNOSIS — E282 Polycystic ovarian syndrome: Secondary | ICD-10-CM | POA: Diagnosis not present

## 2017-12-07 DIAGNOSIS — E063 Autoimmune thyroiditis: Secondary | ICD-10-CM | POA: Diagnosis not present

## 2017-12-07 DIAGNOSIS — E119 Type 2 diabetes mellitus without complications: Secondary | ICD-10-CM | POA: Diagnosis not present

## 2017-12-07 DIAGNOSIS — E039 Hypothyroidism, unspecified: Secondary | ICD-10-CM | POA: Diagnosis not present

## 2017-12-14 ENCOUNTER — Ambulatory Visit: Payer: 59 | Admitting: Primary Care

## 2017-12-14 ENCOUNTER — Encounter: Payer: Self-pay | Admitting: Primary Care

## 2017-12-14 DIAGNOSIS — E039 Hypothyroidism, unspecified: Secondary | ICD-10-CM | POA: Diagnosis not present

## 2017-12-14 DIAGNOSIS — E282 Polycystic ovarian syndrome: Secondary | ICD-10-CM | POA: Diagnosis not present

## 2017-12-14 DIAGNOSIS — K219 Gastro-esophageal reflux disease without esophagitis: Secondary | ICD-10-CM

## 2017-12-14 DIAGNOSIS — Z6841 Body Mass Index (BMI) 40.0 and over, adult: Secondary | ICD-10-CM | POA: Diagnosis not present

## 2017-12-14 NOTE — Assessment & Plan Note (Signed)
Endorses A1C of 6.1 on Metformin 1000 mg BID. Continue same. She will bring a copy of her labs. Continue spironolactone daily for edema. BP stable.

## 2017-12-14 NOTE — Progress Notes (Signed)
Subjective:    Patient ID: Erika Henry, female    DOB: 21-Apr-1980, 37 y.o.   MRN: [id number]  HPI  Erika Henry is a 37 year old female who presents today for follow up.  1) Hypothyroidism: Currenlty managed on Armour Thyroid 90 mg. Her last TSH was 3.21 in November 2018. She is following with endocrinology, never underwent biopsy. She was switched from levothyroxine 88 mcg to Armour Thyroid 90 mg per endocrinology. Her endocrinologist is now out of network so she is considering moving her treatment to our office.  2) PCOS: Currently managed on metformin ER 1000 mg twice daily and spironolactone 50 mg. Spironolactone was added due to chronic ankle edema. Her last A1C was 6.1 in July 2019 per endocrinology. She has labs from her last visit and will bring over a copy.   3) Morbid Obesity: Suffered with obesity for years. She is interested in weight loss surgery. She endorses a poor diet.  Diet currently consists of:  Breakfast: Muffins, protein shakes, peanut butter crackers, sometimes skips Lunch: Sandwiches, restaurants, soup. Fast food/restaurant food 2 times weekly.  Dinner: Beans, chicken, soup, fast food/restaurant food. 4-5 times weekly eating out for dinner. Snacks: Occasionally, 100 calorie snack packs Desserts: Occasional 100 calorie packs. 5 times weekly  Beverages: Water, diet soda, regular soda on the weekend, coffee, sweet tea occasionally  Exercise: she does a work out class on Tuesday and Thursday.  4) GERD: Currently managed on omeprazole 20 mg for which she takes 1-2 times weekly. She would like to switch to Pepcid or Zantac.   Review of Systems  Respiratory: Negative for shortness of breath.   Cardiovascular: Negative for chest pain.  Gastrointestinal:       Intermittent GERD  Neurological: Negative for headaches.       Past Medical History:  Diagnosis Date  . ASCUS with positive high risk HPV cervical 2004   neg colpo bx 2/04  . GERD (gastroesophageal  reflux disease)   . Hypothyroid   . Lichen sclerosus   . Migraines   . PCOS (polycystic ovarian syndrome)   . Vaccine for human papilloma virus (HPV) types 6, 11, 16, and 18 administered      Social History   Socioeconomic History  . Marital status: Married    Spouse name: Not on file  . Number of children: Not on file  . Years of education: Not on file  . Highest education level: Not on file  Occupational History  . Not on file  Social Needs  . Financial resource strain: Not on file  . Food insecurity:    Worry: Not on file    Inability: Not on file  . Transportation needs:    Medical: Not on file    Non-medical: Not on file  Tobacco Use  . Smoking status: Never Smoker  . Smokeless tobacco: Never Used  Substance and Sexual Activity  . Alcohol use: Yes    Alcohol/week: 1.0 - 2.0 standard drinks    Types: 1 - 2 Glasses of wine per week    Comment: occasionally once a month  . Drug use: Not Currently  . Sexual activity: Yes  Lifestyle  . Physical activity:    Days per week: Not on file    Minutes per session: Not on file  . Stress: Not on file  Relationships  . Social connections:    Talks on phone: Not on file    Gets together: Not on file    Attends  religious service: Not on file    Active member of club or organization: Not on file    Attends meetings of clubs or organizations: Not on file    Relationship status: Not on file  . Intimate partner violence:    Fear of current or ex partner: Not on file    Emotionally abused: Not on file    Physically abused: Not on file    Forced sexual activity: Not on file  Other Topics Concern  . Not on file  Social History Narrative   Married.   No children.   Works as a Technical brewer at [employer].   Enjoys exercising, shopping, bowling, traveling.    Past Surgical History:  Procedure Laterality Date  . COLPOSCOPY  2004  . WISDOM TOOTH EXTRACTION  2005    Family History  Problem Relation Age of Onset  .  Hyperlipidemia Mother   . Hypertension Mother   . Hyperlipidemia Father   . Hypertension Father   . Depression Brother   . Depression Maternal Grandmother   . Diabetes Maternal Grandmother   . Stroke Maternal Grandmother   . COPD Maternal Grandfather   . Diabetes Maternal Grandfather   . Stroke Maternal Grandfather   . Depression Paternal Grandmother   . Breast cancer Paternal Grandmother 57  . Arthritis Paternal Grandfather   . Cancer Paternal Grandfather   . Heart disease Paternal Grandfather   . Hyperlipidemia Paternal Grandfather   . Hypertension Paternal Grandfather   . Kidney disease Paternal Grandfather     No Known Allergies  Current Outpatient Medications on File Prior to Visit  Medication Sig Dispense Refill  . ARMOUR THYROID 90 MG tablet Take 90 mg by mouth daily.   2  . Cholecalciferol (VITAMIN D3) 2000 units capsule Take by mouth.    . metformin (FORTAMET) 1000 MG (OSM) 24 hr tablet Take 1,000 mg by mouth 2 (two) times daily with a meal.    . Multiple Vitamin (MULTI-VITAMINS) TABS Take by mouth.    . spironolactone (ALDACTONE) 50 MG tablet Take 50 mg by mouth daily.   2  . triamcinolone (KENALOG) 0.147 MG/GM topical spray Apply topically 2 (two) times daily. (Patient not taking: Reported on 12/14/2017) 63 g 0   No current facility-administered medications on file prior to visit.     BP 114/76   Pulse 82   Temp 98.3 F (36.8 C) (Oral)   Ht 5\' 2"  (1.575 m)   Wt 256 lb (116.1 kg)   LMP 12/02/2017   SpO2 97%   BMI 46.82 kg/m    Objective:   Physical Exam  Constitutional: She appears well-nourished.  Neck: Neck supple.  Cardiovascular: Normal rate and regular rhythm.  Respiratory: Effort normal and breath sounds normal.  Skin: Skin is warm and dry.           Assessment & Plan:

## 2017-12-14 NOTE — Assessment & Plan Note (Signed)
Following with endocrinology but may transition care to our office. She will bring labs for recent TSH. Continue current regimen.

## 2017-12-14 NOTE — Assessment & Plan Note (Signed)
Intermittent, using omeprazole PRN. Discussed to try using Pepcid or Zantac PRN.  Avoid triggers.

## 2017-12-14 NOTE — Assessment & Plan Note (Signed)
Long discussion today regarding her diet. Offered tips for weight loss including avoid restaurant food, fast food. Discussed to mainly focus on fruit, vegetables, lean protein, whole grains, water.   She would like to hold off on the weight loss surgery referral and will update if she changes her mind.  Offered to see her every 3 months for weight loss coaching.

## 2017-12-14 NOTE — Patient Instructions (Signed)
Follow up with your endocrinologist as scheduled.  Continue exercising. You should be getting 150 minutes of moderate intensity exercise weekly.  Make sure to eat vegetables, fruit, lean protein as your base. Eat whole grains like quinoa, couscous, brown rice but limit portions.   Be careful with restaurant food as it's very unhealthy. Start looking at the nutrition facts on [employer] sites. Try to bring your lunch to work.  Ensure you are consuming 64 ounces of water daily. Stop drinking sugary drinks as this adds empty calories.   You can try famotidine (Pepcid) or ranitidine (Zantac) for heartburn as needed.  It was a pleasure to see you today!   Calorie Counting for Weight Loss Calories are units of energy. Your body needs a certain amount of calories from food to keep you going throughout the day. When you eat more calories than your body needs, your body stores the extra calories as fat. When you eat fewer calories than your body needs, your body burns fat to get the energy it needs. Calorie counting means keeping track of how many calories you eat and drink each day. Calorie counting can be helpful if you need to lose weight. If you make sure to eat fewer calories than your body needs, you should lose weight. Ask your health care provider what a healthy weight is for you. For calorie counting to work, you will need to eat the right number of calories in a day in order to lose a healthy amount of weight per week. A dietitian can help you determine how many calories you need in a day and will give you suggestions on how to reach your calorie goal.  A healthy amount of weight to lose per week is usually 1-2 lb (0.5-0.9 kg). This usually means that your daily calorie intake should be reduced by 500-750 calories.  Eating 1,200 - 1,500 calories per day can help most women lose weight.  Eating 1,500 - 1,800 calories per day can help most men lose weight.  What is my plan? My goal is  to have __________ calories per day. If I have this many calories per day, I should lose around __________ pounds per week. What do I need to know about calorie counting? In order to meet your daily calorie goal, you will need to:  Find out how many calories are in each food you would like to eat. Try to do this before you eat.  Decide how much of the food you plan to eat.  Write down what you ate and how many calories it had. Doing this is called keeping a food log.  To successfully lose weight, it is important to balance calorie counting with a healthy lifestyle that includes regular activity. Aim for 150 minutes of moderate exercise (such as walking) or 75 minutes of vigorous exercise (such as running) each week. Where do I find calorie information?  The number of calories in a food can be found on a Nutrition Facts label. If a food does not have a Nutrition Facts label, try to look up the calories online or ask your dietitian for help. Remember that calories are listed per serving. If you choose to have more than one serving of a food, you will have to multiply the calories per serving by the amount of servings you plan to eat. For example, the label on a package of bread might say that a serving size is 1 slice and that there are 90 calories in a  serving. If you eat 1 slice, you will have eaten 90 calories. If you eat 2 slices, you will have eaten 180 calories. How do I keep a food log? Immediately after each meal, record the following information in your food log:  What you ate. Don't forget to include toppings, sauces, and other extras on the food.  How much you ate. This can be measured in cups, ounces, or number of items.  How many calories each food and drink had.  The total number of calories in the meal.  Keep your food log near you, such as in a small notebook in your pocket, or use a mobile app or website. Some programs will calculate calories for you and show you how many  calories you have left for the day to meet your goal. What are some calorie counting tips?  Use your calories on foods and drinks that will fill you up and not leave you hungry: ? Some examples of foods that fill you up are nuts and nut butters, vegetables, lean proteins, and high-fiber foods like whole grains. High-fiber foods are foods with more than 5 g fiber per serving. ? Drinks such as sodas, specialty coffee drinks, alcohol, and juices have a lot of calories, yet do not fill you up.  Eat nutritious foods and avoid empty calories. Empty calories are calories you get from foods or beverages that do not have many vitamins or protein, such as candy, sweets, and soda. It is better to have a nutritious high-calorie food (such as an avocado) than a food with few nutrients (such as a bag of chips).  Know how many calories are in the foods you eat most often. This will help you calculate calorie counts faster.  Pay attention to calories in drinks. Low-calorie drinks include water and unsweetened drinks.  Pay attention to nutrition labels for "low fat" or "fat free" foods. These foods sometimes have the same amount of calories or more calories than the full fat versions. They also often have added sugar, starch, or salt, to make up for flavor that was removed with the fat.  Find a way of tracking calories that works for you. Get creative. Try different apps or programs if writing down calories does not work for you. What are some portion control tips?  Know how many calories are in a serving. This will help you know how many servings of a certain food you can have.  Use a measuring cup to measure serving sizes. You could also try weighing out portions on a kitchen scale. With time, you will be able to estimate serving sizes for some foods.  Take some time to put servings of different foods on your favorite plates, bowls, and cups so you know what a serving looks like.  Try not to eat straight  from a bag or box. Doing this can lead to overeating. Put the amount you would like to eat in a cup or on a plate to make sure you are eating the right portion.  Use smaller plates, glasses, and bowls to prevent overeating.  Try not to multitask (for example, watch TV or use your computer) while eating. If it is time to eat, sit down at a table and enjoy your food. This will help you to know when you are full. It will also help you to be aware of what you are eating and how much you are eating. What are tips for following this plan? Reading food labels  Check  the calorie count compared to the serving size. The serving size may be smaller than what you are used to eating.  Check the source of the calories. Make sure the food you are eating is high in vitamins and protein and low in saturated and trans fats. Shopping  Read nutrition labels while you shop. This will help you make healthy decisions before you decide to purchase your food.  Make a grocery list and stick to it. Cooking  Try to cook your favorite foods in a healthier way. For example, try baking instead of frying.  Use low-fat dairy products. Meal planning  Use more fruits and vegetables. Half of your plate should be fruits and vegetables.  Include lean proteins like poultry and fish. How do I count calories when eating out?  Ask for smaller portion sizes.  Consider sharing an entree and sides instead of getting your own entree.  If you get your own entree, eat only half. Ask for a box at the beginning of your meal and put the rest of your entree in it so you are not tempted to eat it.  If calories are listed on the menu, choose the lower calorie options.  Choose dishes that include vegetables, fruits, whole grains, low-fat dairy products, and lean protein.  Choose items that are boiled, broiled, grilled, or steamed. Stay away from items that are buttered, battered, fried, or served with cream sauce. Items labeled  "crispy" are usually fried, unless stated otherwise.  Choose water, low-fat milk, unsweetened iced tea, or other drinks without added sugar. If you want an alcoholic beverage, choose a lower calorie option such as a glass of wine or light beer.  Ask for dressings, sauces, and syrups on the side. These are usually high in calories, so you should limit the amount you eat.  If you want a salad, choose a garden salad and ask for grilled meats. Avoid extra toppings like bacon, cheese, or fried items. Ask for the dressing on the side, or ask for olive oil and vinegar or lemon to use as dressing.  Estimate how many servings of a food you are given. For example, a serving of cooked rice is  cup or about the size of half a baseball. Knowing serving sizes will help you be aware of how much food you are eating at restaurants. The list below tells you how big or small some common portion sizes are based on everyday objects: ? 1 oz-4 stacked dice. ? 3 oz-1 deck of cards. ? 1 tsp-1 die. ? 1 Tbsp- a ping-pong ball. ? 2 Tbsp-1 ping-pong ball. ?  cup- baseball. ? 1 cup-1 baseball. Summary  Calorie counting means keeping track of how many calories you eat and drink each day. If you eat fewer calories than your body needs, you should lose weight.  A healthy amount of weight to lose per week is usually 1-2 lb (0.5-0.9 kg). This usually means reducing your daily calorie intake by 500-750 calories.  The number of calories in a food can be found on a Nutrition Facts label. If a food does not have a Nutrition Facts label, try to look up the calories online or ask your dietitian for help.  Use your calories on foods and drinks that will fill you up, and not on foods and drinks that will leave you hungry.  Use smaller plates, glasses, and bowls to prevent overeating. This information is not intended to replace advice given to you by your health care provider.  Make sure you discuss any questions you have with  your health care provider. Document Released: 03/13/2005 Document Revised: 02/11/2016 Document Reviewed: 02/11/2016 Elsevier Interactive Patient Education  [employer].

## 2017-12-17 DIAGNOSIS — E119 Type 2 diabetes mellitus without complications: Secondary | ICD-10-CM | POA: Diagnosis not present

## 2017-12-17 DIAGNOSIS — E039 Hypothyroidism, unspecified: Secondary | ICD-10-CM | POA: Diagnosis not present

## 2017-12-17 DIAGNOSIS — E669 Obesity, unspecified: Secondary | ICD-10-CM | POA: Diagnosis not present

## 2017-12-17 DIAGNOSIS — E282 Polycystic ovarian syndrome: Secondary | ICD-10-CM | POA: Diagnosis not present

## 2017-12-17 DIAGNOSIS — E063 Autoimmune thyroiditis: Secondary | ICD-10-CM | POA: Diagnosis not present

## 2017-12-17 DIAGNOSIS — Z6841 Body Mass Index (BMI) 40.0 and over, adult: Secondary | ICD-10-CM | POA: Diagnosis not present

## 2017-12-17 DIAGNOSIS — K219 Gastro-esophageal reflux disease without esophagitis: Secondary | ICD-10-CM | POA: Diagnosis not present

## 2018-02-19 DIAGNOSIS — E669 Obesity, unspecified: Secondary | ICD-10-CM | POA: Diagnosis not present

## 2018-02-19 DIAGNOSIS — E119 Type 2 diabetes mellitus without complications: Secondary | ICD-10-CM | POA: Diagnosis not present

## 2018-02-19 DIAGNOSIS — E282 Polycystic ovarian syndrome: Secondary | ICD-10-CM | POA: Diagnosis not present

## 2018-02-19 DIAGNOSIS — E039 Hypothyroidism, unspecified: Secondary | ICD-10-CM | POA: Diagnosis not present

## 2018-02-19 DIAGNOSIS — E063 Autoimmune thyroiditis: Secondary | ICD-10-CM | POA: Diagnosis not present

## 2018-02-26 DIAGNOSIS — Z6841 Body Mass Index (BMI) 40.0 and over, adult: Secondary | ICD-10-CM | POA: Diagnosis not present

## 2018-02-26 DIAGNOSIS — E119 Type 2 diabetes mellitus without complications: Secondary | ICD-10-CM | POA: Diagnosis not present

## 2018-02-26 DIAGNOSIS — K219 Gastro-esophageal reflux disease without esophagitis: Secondary | ICD-10-CM | POA: Diagnosis not present

## 2018-02-26 DIAGNOSIS — E039 Hypothyroidism, unspecified: Secondary | ICD-10-CM | POA: Diagnosis not present

## 2018-02-26 DIAGNOSIS — E063 Autoimmune thyroiditis: Secondary | ICD-10-CM | POA: Diagnosis not present

## 2018-02-26 DIAGNOSIS — E282 Polycystic ovarian syndrome: Secondary | ICD-10-CM | POA: Diagnosis not present

## 2018-02-26 DIAGNOSIS — E669 Obesity, unspecified: Secondary | ICD-10-CM | POA: Diagnosis not present

## 2018-03-26 ENCOUNTER — Encounter: Payer: Self-pay | Admitting: Internal Medicine

## 2018-03-26 ENCOUNTER — Telehealth: Payer: Self-pay

## 2018-03-26 ENCOUNTER — Encounter: Payer: Self-pay | Admitting: *Deleted

## 2018-03-26 ENCOUNTER — Ambulatory Visit: Payer: 59 | Admitting: Internal Medicine

## 2018-03-26 VITALS — BP 104/76 | HR 82 | Temp 97.9°F | Ht 62.0 in | Wt 249.0 lb

## 2018-03-26 DIAGNOSIS — R42 Dizziness and giddiness: Secondary | ICD-10-CM | POA: Insufficient documentation

## 2018-03-26 MED ORDER — MECLIZINE HCL 25 MG PO TABS
25.0000 mg | ORAL_TABLET | Freq: Three times a day (TID) | ORAL | 0 refills | Status: DC | PRN
Start: 2018-03-26 — End: 2019-07-16

## 2018-03-26 NOTE — Telephone Encounter (Signed)
Pt said dizziness started last night with some nausea on and off. Dizziness is triggered by laying down. Pt has Hx of inner problem. No fever or other symptoms.Pt request to be seen. Pt scheduled appt 03/26/18 at 10:15.FYI to Dr Silvio Pate.

## 2018-03-26 NOTE — Telephone Encounter (Signed)
Will evaluate at the [facility] like typical vertigo

## 2018-03-26 NOTE — Progress Notes (Signed)
Subjective:    Patient ID: Erika Henry, female    DOB: Dec 05, 1980, 37 y.o.   MRN: [id number]  HPI Here due to dizziness With husband  Yesterday evening, got home and lied down--then the room started spinning briefly Then went to the gym---had similar experience of spinning Had to close her eyes but had sensation of about to fall (even sitting) Symptoms come on with head or position changes Still present when lying down  No recent travel Tries to avoid salt  Current Outpatient Medications on File Prior to Visit  Medication Sig Dispense Refill  . ARMOUR THYROID 90 MG tablet Take 120 mg by mouth daily.   2  . Cholecalciferol (VITAMIN D3) 2000 units capsule Take by mouth.    . metformin (FORTAMET) 1000 MG (OSM) 24 hr tablet Take 1,000 mg by mouth 2 (two) times daily with a meal.    . Multiple Vitamin (MULTI-VITAMINS) TABS Take by mouth.    . spironolactone (ALDACTONE) 50 MG tablet Take 50 mg by mouth daily.   2  . triamcinolone (KENALOG) 0.147 MG/GM topical spray Apply topically 2 (two) times daily. 63 g 0   No current facility-administered medications on file prior to visit.     No Known Allergies  Past Medical History:  Diagnosis Date  . ASCUS with positive high risk HPV cervical 2004   neg colpo bx 2/04  . GERD (gastroesophageal reflux disease)   . Hypothyroid   . Lichen sclerosus   . Migraines   . PCOS (polycystic ovarian syndrome)   . Vaccine for human papilloma virus (HPV) types 6, 11, 16, and 18 administered     Past Surgical History:  Procedure Laterality Date  . COLPOSCOPY  2004  . WISDOM TOOTH EXTRACTION  2005    Family History  Problem Relation Age of Onset  . Hyperlipidemia Mother   . Hypertension Mother   . Hyperlipidemia Father   . Hypertension Father   . Depression Brother   . Depression Maternal Grandmother   . Diabetes Maternal Grandmother   . Stroke Maternal Grandmother   . COPD Maternal Grandfather   . Diabetes Maternal Grandfather     . Stroke Maternal Grandfather   . Depression Paternal Grandmother   . Breast cancer Paternal Grandmother 15  . Arthritis Paternal Grandfather   . Cancer Paternal Grandfather   . Heart disease Paternal Grandfather   . Hyperlipidemia Paternal Grandfather   . Hypertension Paternal Grandfather   . Kidney disease Paternal Grandfather     Social History   Socioeconomic History  . Marital status: Married    Spouse name: Not on file  . Number of children: Not on file  . Years of education: Not on file  . Highest education level: Not on file  Occupational History  . Not on file  Social Needs  . Financial resource strain: Not on file  . Food insecurity:    Worry: Not on file    Inability: Not on file  . Transportation needs:    Medical: Not on file    Non-medical: Not on file  Tobacco Use  . Smoking status: Never Smoker  . Smokeless tobacco: Never Used  Substance and Sexual Activity  . Alcohol use: Yes    Alcohol/week: 1.0 - 2.0 standard drinks    Types: 1 - 2 Glasses of wine per week    Comment: occasionally once a month  . Drug use: Not Currently  . Sexual activity: Yes  Lifestyle  . Physical  activity:    Days per week: Not on file    Minutes per session: Not on file  . Stress: Not on file  Relationships  . Social connections:    Talks on phone: Not on file    Gets together: Not on file    Attends religious service: Not on file    Active member of club or organization: Not on file    Attends meetings of clubs or organizations: Not on file    Relationship status: Not on file  . Intimate partner violence:    Fear of current or ex partner: Not on file    Emotionally abused: Not on file    Physically abused: Not on file    Forced sexual activity: Not on file  Other Topics Concern  . Not on file  Social History Narrative   Married.   No children.   Works as a Technical brewer at [employer].   Enjoys exercising, shopping, bowling, traveling.   Review of Systems No  fever or illness No tinnitus or hearing loss    Objective:   Physical Exam  Constitutional: She is oriented to person, place, and time.  HENT:  Mouth/Throat: Oropharynx is clear and moist. No oropharyngeal exudate.  TMs normal  Neck: No thyromegaly present.  Cardiovascular: Normal rate, regular rhythm and normal heart sounds. Exam reveals no gallop.  No murmur heard. Respiratory: Effort normal and breath sounds normal. No respiratory distress. She has no wheezes. She has no rales.  Lymphadenopathy:    She has no cervical adenopathy.  Neurological: She is oriented to person, place, and time. She has normal strength. No cranial nerve deficit. She displays a negative Romberg sign. Coordination and gait normal.           Assessment & Plan:

## 2018-03-26 NOTE — Telephone Encounter (Signed)
This encounter was created in error - please disregard.

## 2018-03-26 NOTE — Assessment & Plan Note (Signed)
Classic vertigo No neurologic symptoms Reassured Will use meclizine till symptoms ease off

## 2018-05-27 DIAGNOSIS — E039 Hypothyroidism, unspecified: Secondary | ICD-10-CM | POA: Diagnosis not present

## 2018-05-27 DIAGNOSIS — K219 Gastro-esophageal reflux disease without esophagitis: Secondary | ICD-10-CM | POA: Diagnosis not present

## 2018-05-27 DIAGNOSIS — E063 Autoimmune thyroiditis: Secondary | ICD-10-CM | POA: Diagnosis not present

## 2018-05-27 DIAGNOSIS — E119 Type 2 diabetes mellitus without complications: Secondary | ICD-10-CM | POA: Diagnosis not present

## 2018-05-27 DIAGNOSIS — E282 Polycystic ovarian syndrome: Secondary | ICD-10-CM | POA: Diagnosis not present

## 2018-05-27 DIAGNOSIS — E669 Obesity, unspecified: Secondary | ICD-10-CM | POA: Diagnosis not present

## 2018-06-04 DIAGNOSIS — Z6841 Body Mass Index (BMI) 40.0 and over, adult: Secondary | ICD-10-CM | POA: Diagnosis not present

## 2018-06-04 DIAGNOSIS — E039 Hypothyroidism, unspecified: Secondary | ICD-10-CM | POA: Diagnosis not present

## 2018-06-04 DIAGNOSIS — E669 Obesity, unspecified: Secondary | ICD-10-CM | POA: Diagnosis not present

## 2018-06-04 DIAGNOSIS — E282 Polycystic ovarian syndrome: Secondary | ICD-10-CM | POA: Diagnosis not present

## 2018-06-04 DIAGNOSIS — E063 Autoimmune thyroiditis: Secondary | ICD-10-CM | POA: Diagnosis not present

## 2018-06-04 DIAGNOSIS — K219 Gastro-esophageal reflux disease without esophagitis: Secondary | ICD-10-CM | POA: Diagnosis not present

## 2018-06-04 DIAGNOSIS — E119 Type 2 diabetes mellitus without complications: Secondary | ICD-10-CM | POA: Diagnosis not present

## 2018-06-05 DIAGNOSIS — H5213 Myopia, bilateral: Secondary | ICD-10-CM | POA: Diagnosis not present

## 2018-10-03 ENCOUNTER — Other Ambulatory Visit: Payer: Self-pay | Admitting: Primary Care

## 2018-10-03 ENCOUNTER — Telehealth: Payer: Self-pay

## 2018-10-03 DIAGNOSIS — E039 Hypothyroidism, unspecified: Secondary | ICD-10-CM

## 2018-10-03 DIAGNOSIS — E559 Vitamin D deficiency, unspecified: Secondary | ICD-10-CM

## 2018-10-03 DIAGNOSIS — Z0000 Encounter for general adult medical examination without abnormal findings: Secondary | ICD-10-CM

## 2018-10-03 NOTE — Telephone Encounter (Signed)
Left message to call clinic, needs COVID screen and back door lab info   

## 2018-10-07 ENCOUNTER — Other Ambulatory Visit: Payer: Self-pay

## 2018-10-07 ENCOUNTER — Other Ambulatory Visit (INDEPENDENT_AMBULATORY_CARE_PROVIDER_SITE_OTHER): Payer: 59

## 2018-10-07 DIAGNOSIS — E039 Hypothyroidism, unspecified: Secondary | ICD-10-CM

## 2018-10-07 DIAGNOSIS — Z0000 Encounter for general adult medical examination without abnormal findings: Secondary | ICD-10-CM | POA: Diagnosis not present

## 2018-10-07 DIAGNOSIS — E559 Vitamin D deficiency, unspecified: Secondary | ICD-10-CM | POA: Diagnosis not present

## 2018-10-07 LAB — VITAMIN D 25 HYDROXY (VIT D DEFICIENCY, FRACTURES): VITD: 24.18 ng/mL — ABNORMAL LOW (ref 30.00–100.00)

## 2018-10-07 LAB — COMPREHENSIVE METABOLIC PANEL
ALT: 22 U/L (ref 0–35)
AST: 19 U/L (ref 0–37)
Albumin: 4.2 g/dL (ref 3.5–5.2)
Alkaline Phosphatase: 78 U/L (ref 39–117)
BUN: 13 mg/dL (ref 6–23)
CO2: 22 mEq/L (ref 19–32)
Calcium: 9.1 mg/dL (ref 8.4–10.5)
Chloride: 106 mEq/L (ref 96–112)
Creatinine, Ser: 0.86 mg/dL (ref 0.40–1.20)
GFR: 73.44 mL/min (ref >60.00)
Glucose, Bld: 99 mg/dL (ref 70–99)
Potassium: 4.4 mEq/L (ref 3.5–5.1)
Sodium: 138 mEq/L (ref 135–145)
Total Bilirubin: 0.4 mg/dL (ref 0.2–1.2)
Total Protein: 7.5 g/dL (ref 6.0–8.3)

## 2018-10-07 LAB — LIPID PANEL
Cholesterol: 179 mg/dL (ref 0–200)
HDL: 45.8 mg/dL (ref >39.00)
LDL Cholesterol: 112 mg/dL — ABNORMAL HIGH (ref 0–99)
NonHDL: 133.41
Total CHOL/HDL Ratio: 4
Triglycerides: 109 mg/dL (ref 0.0–149.0)
VLDL: 21.8 mg/dL (ref 0.0–40.0)

## 2018-10-07 LAB — TSH: TSH: 0.44 u[IU]/mL (ref 0.35–4.50)

## 2018-10-11 ENCOUNTER — Ambulatory Visit (INDEPENDENT_AMBULATORY_CARE_PROVIDER_SITE_OTHER): Payer: 59 | Admitting: Primary Care

## 2018-10-11 VITALS — BP 122/74 | HR 75 | Temp 97.9°F | Ht 62.0 in | Wt 252.2 lb

## 2018-10-11 DIAGNOSIS — Z0000 Encounter for general adult medical examination without abnormal findings: Secondary | ICD-10-CM | POA: Diagnosis not present

## 2018-10-11 DIAGNOSIS — E039 Hypothyroidism, unspecified: Secondary | ICD-10-CM

## 2018-10-11 DIAGNOSIS — Z0001 Encounter for general adult medical examination with abnormal findings: Secondary | ICD-10-CM | POA: Insufficient documentation

## 2018-10-11 DIAGNOSIS — K219 Gastro-esophageal reflux disease without esophagitis: Secondary | ICD-10-CM

## 2018-10-11 DIAGNOSIS — E559 Vitamin D deficiency, unspecified: Secondary | ICD-10-CM

## 2018-10-11 DIAGNOSIS — E282 Polycystic ovarian syndrome: Secondary | ICD-10-CM | POA: Diagnosis not present

## 2018-10-11 DIAGNOSIS — L409 Psoriasis, unspecified: Secondary | ICD-10-CM | POA: Diagnosis not present

## 2018-10-11 MED ORDER — CLOBETASOL PROPIONATE 0.05 % EX SOLN: 1.0000 "application " | Freq: Two times a day (BID) | CUTANEOUS | 0 refills | Status: DC

## 2018-10-11 NOTE — Progress Notes (Signed)
Subjective:    Patient ID: Erika Henry, female    DOB: June 06, 1980, 38 y.o.   MRN: [id number]  HPI  Virtual Visit via Video Note  I connected with Erika Henry on 10/11/18 at  3:20 PM EDT by a video enabled telemedicine application and verified that I am speaking with the correct person using two identifiers.  Location: Patient: Home Provider: Office   I discussed the limitations of evaluation and management by telemedicine and the availability of in person appointments. The patient expressed understanding and agreed to proceed.  History of Present Illness:  Ms. Doiron is a 38 year old female who presents today for complete physical.  Immunizations: -Tetanus: Unsure, believes it's been with 10 years, she will check records. -Influenza: Due this season   Diet: She endorses a fair diet. She's been eating processed food, take out/fast food. She's been eating more junk food. She is drinking water, soda, diet green tea. Exercise: She is not exercising   Eye exam: Completed in 2020 Dental exam: Completes semi-annually  Pap Smear: Completed in 2019    Observations/Objective:  Alert and oriented. Appears well, not sickly. No distress. Speaking in complete sentences.   Assessment and Plan:  See problem based charting.  Follow Up Instructions:  Apply the clobetasol shampoo twice daily for 1 week or as needed.  Start exercising. You should be getting 150 minutes of moderate intensity exercise weekly.  It's important to improve your diet by reducing consumption of fast food, fried food, processed snack foods, sugary drinks. Increase consumption of fresh vegetables and fruits, whole grains, water.  Ensure you are drinking 64 ounces of water daily.  Follow-up with endocrinology as scheduled.  It was a pleasure to see you today!    I discussed the assessment and treatment plan with the patient. The patient was provided an opportunity to ask questions and all were  answered. The patient agreed with the plan and demonstrated an understanding of the instructions.   The patient was advised to call back or seek an in-person evaluation if the symptoms worsen or if the condition fails to improve as anticipated.      Pleas Koch, NP    Review of Systems  Constitutional: Negative for unexpected weight change.  HENT: Negative for rhinorrhea.   Respiratory: Negative for cough and shortness of breath.   Cardiovascular: Negative for chest pain.  Gastrointestinal: Negative for constipation and diarrhea.  Genitourinary: Negative for difficulty urinating.  Musculoskeletal: Negative for arthralgias and myalgias.  Skin:       Occasional acne breakouts. Dry scalp, itchy scalp, localized flaking of skin. No improvement with OTC treatment.   Allergic/Immunologic: Negative for environmental allergies.  Neurological: Negative for dizziness, numbness and headaches.  Psychiatric/Behavioral: The patient is not nervous/anxious.         Past Medical History:  Diagnosis Date  . ASCUS with positive high risk HPV cervical 2004   neg colpo bx 2/04  . GERD (gastroesophageal reflux disease)   . Hypothyroid   . Lichen sclerosus   . Migraines   . PCOS (polycystic ovarian syndrome)   . Vaccine for human papilloma virus (HPV) types 6, 11, 16, and 18 administered      Social History   Socioeconomic History  . Marital status: Married    Spouse name: Not on file  . Number of children: Not on file  . Years of education: Not on file  . Highest education level: Not on file  Occupational History  .  Not on file  Social Needs  . Financial resource strain: Not on file  . Food insecurity    Worry: Not on file    Inability: Not on file  . Transportation needs    Medical: Not on file    Non-medical: Not on file  Tobacco Use  . Smoking status: Never Smoker  . Smokeless tobacco: Never Used  Substance and Sexual Activity  . Alcohol use: Yes    Alcohol/week: 1.0  - 2.0 standard drinks    Types: 1 - 2 Glasses of wine per week    Comment: occasionally once a month  . Drug use: Not Currently  . Sexual activity: Yes  Lifestyle  . Physical activity    Days per week: Not on file    Minutes per session: Not on file  . Stress: Not on file  Relationships  . Social Herbalist on phone: Not on file    Gets together: Not on file    Attends religious service: Not on file    Active member of club or organization: Not on file    Attends meetings of clubs or organizations: Not on file    Relationship status: Not on file  . Intimate partner violence    Fear of current or ex partner: Not on file    Emotionally abused: Not on file    Physically abused: Not on file    Forced sexual activity: Not on file  Other Topics Concern  . Not on file  Social History Narrative   Married.   No children.   Works as a Technical brewer at [employer].   Enjoys exercising, shopping, bowling, traveling.    Past Surgical History:  Procedure Laterality Date  . COLPOSCOPY  2004  . WISDOM TOOTH EXTRACTION  2005    Family History  Problem Relation Age of Onset  . Hyperlipidemia Mother   . Hypertension Mother   . Hyperlipidemia Father   . Hypertension Father   . Depression Brother   . Depression Maternal Grandmother   . Diabetes Maternal Grandmother   . Stroke Maternal Grandmother   . COPD Maternal Grandfather   . Diabetes Maternal Grandfather   . Stroke Maternal Grandfather   . Depression Paternal Grandmother   . Breast cancer Paternal Grandmother 22  . Arthritis Paternal Grandfather   . Cancer Paternal Grandfather   . Heart disease Paternal Grandfather   . Hyperlipidemia Paternal Grandfather   . Hypertension Paternal Grandfather   . Kidney disease Paternal Grandfather     No Known Allergies  Current Outpatient Medications on File Prior to Visit  Medication Sig Dispense Refill  . ARMOUR THYROID 90 MG tablet Take 120 mg by mouth daily.   2  .  Cholecalciferol (VITAMIN D3) 2000 units capsule Take by mouth.    . meclizine (ANTIVERT) 25 MG tablet Take 1 tablet (25 mg total) by mouth 3 (three) times daily as needed for dizziness. 90 tablet 0  . metformin (FORTAMET) 1000 MG (OSM) 24 hr tablet Take 1,000 mg by mouth 2 (two) times daily with a meal.    . Multiple Vitamin (MULTI-VITAMINS) TABS Take by mouth.    . spironolactone (ALDACTONE) 50 MG tablet Take 50 mg by mouth daily.   2  . triamcinolone (KENALOG) 0.147 MG/GM topical spray Apply topically 2 (two) times daily. 63 g 0   No current facility-administered medications on file prior to visit.     BP 122/74   Pulse 75  Temp 97.9 F (36.6 C) (Temporal)   Ht 5\' 2"  (1.575 m)   Wt 252 lb 4 oz (114.4 kg)   LMP 09/29/2018   SpO2 98%   BMI 46.14 kg/m    Objective:   Physical Exam  Constitutional: She is oriented to person, place, and time. She appears well-nourished.  HENT:  Head: Normocephalic.  Neck: Neck supple.  Respiratory: Effort normal.  Musculoskeletal: Normal range of motion.  Neurological: She is alert and oriented to person, place, and time.  Skin: Skin is dry.  Psychiatric: She has a normal mood and affect.           Assessment & Plan:

## 2018-10-11 NOTE — Assessment & Plan Note (Signed)
Following with endocrinology, managed on metformin and spirolactone.  Recent fasting glucose stable.

## 2018-10-11 NOTE — Patient Instructions (Signed)
Apply the clobetasol shampoo twice daily for 1 week or as needed.  Start exercising. You should be getting 150 minutes of moderate intensity exercise weekly.  It's important to improve your diet by reducing consumption of fast food, fried food, processed snack foods, sugary drinks. Increase consumption of fresh vegetables and fruits, whole grains, water.  Ensure you are drinking 64 ounces of water daily.  Follow-up with endocrinology as scheduled.  It was a pleasure to see you today!

## 2018-10-11 NOTE — Assessment & Plan Note (Signed)
Tetanus up-to-date per patient, she will look through her records and update [place].  Influenza vaccination due this fall. Pap smear up-to-date. Strongly advise she work on regular exercise and a healthy diet. Virtual exam unremarkable. Labs reviewed. Follow-up in 1 year for CPE.

## 2018-10-11 NOTE — Assessment & Plan Note (Signed)
Following with endocrinology, recent TSH stable. Compliant to Armour Thyroid 120 mg daily. She is taking her medication with coffee and other medications, discussed appropriate instructions.   She will see endocrinology next month.

## 2018-10-11 NOTE — Assessment & Plan Note (Signed)
Chronic.  Recently more bothersome.  No improvement with OTC topical agents.  Did well on clobetasol in the past.  Prescription provided for clobetasol shampoo to use twice daily for 1 week or as needed.

## 2018-10-11 NOTE — Assessment & Plan Note (Signed)
Infrequent symptoms, not taking omeprazole. Trying to avoid triggers.

## 2018-10-11 NOTE — Assessment & Plan Note (Signed)
Recent vitamin D level low. Discussed to resume vitamin D daily.

## 2018-11-04 DIAGNOSIS — K219 Gastro-esophageal reflux disease without esophagitis: Secondary | ICD-10-CM | POA: Diagnosis not present

## 2018-11-04 DIAGNOSIS — E063 Autoimmune thyroiditis: Secondary | ICD-10-CM | POA: Diagnosis not present

## 2018-11-04 DIAGNOSIS — E039 Hypothyroidism, unspecified: Secondary | ICD-10-CM | POA: Diagnosis not present

## 2018-11-04 DIAGNOSIS — E669 Obesity, unspecified: Secondary | ICD-10-CM | POA: Diagnosis not present

## 2018-11-04 DIAGNOSIS — E282 Polycystic ovarian syndrome: Secondary | ICD-10-CM | POA: Diagnosis not present

## 2018-11-04 DIAGNOSIS — E119 Type 2 diabetes mellitus without complications: Secondary | ICD-10-CM | POA: Diagnosis not present

## 2018-11-11 DIAGNOSIS — Z6841 Body Mass Index (BMI) 40.0 and over, adult: Secondary | ICD-10-CM | POA: Diagnosis not present

## 2018-11-11 DIAGNOSIS — E282 Polycystic ovarian syndrome: Secondary | ICD-10-CM | POA: Diagnosis not present

## 2018-11-11 DIAGNOSIS — E669 Obesity, unspecified: Secondary | ICD-10-CM | POA: Diagnosis not present

## 2018-11-11 DIAGNOSIS — E039 Hypothyroidism, unspecified: Secondary | ICD-10-CM | POA: Diagnosis not present

## 2018-11-11 DIAGNOSIS — K219 Gastro-esophageal reflux disease without esophagitis: Secondary | ICD-10-CM | POA: Diagnosis not present

## 2018-11-11 DIAGNOSIS — E063 Autoimmune thyroiditis: Secondary | ICD-10-CM | POA: Diagnosis not present

## 2018-11-11 DIAGNOSIS — E119 Type 2 diabetes mellitus without complications: Secondary | ICD-10-CM | POA: Diagnosis not present

## 2019-05-12 ENCOUNTER — Telehealth: Payer: Self-pay

## 2019-05-12 ENCOUNTER — Ambulatory Visit
Admission: EM | Admit: 2019-05-12 | Discharge: 2019-05-12 | Disposition: A | Discharge status: Home / Self Care | Payer: 59

## 2019-05-12 ENCOUNTER — Encounter: Payer: Self-pay | Admitting: Emergency Medicine

## 2019-05-12 ENCOUNTER — Other Ambulatory Visit: Payer: Self-pay

## 2019-05-12 DIAGNOSIS — R0789 Other chest pain: Secondary | ICD-10-CM

## 2019-05-12 DIAGNOSIS — M940 Chondrocostal junction syndrome [Tietze]: Secondary | ICD-10-CM | POA: Diagnosis not present

## 2019-05-12 MED ORDER — CYCLOBENZAPRINE HCL 10 MG PO TABS: 10.0000 mg | ORAL_TABLET | Freq: Two times a day (BID) | ORAL | 0 refills | Status: DC | PRN

## 2019-05-12 MED ORDER — IBUPROFEN 800 MG PO TABS: 800.0000 mg | ORAL_TABLET | Freq: Three times a day (TID) | ORAL | 0 refills | Status: DC | PRN

## 2019-05-12 NOTE — ED Triage Notes (Signed)
Patient in today c/o RUQ pain x 1 day. Patient states the pain in right under her right breast. Patient states she is unable to lay on her right side and states the pain is worse with movement and with deep breaths.

## 2019-05-12 NOTE — Telephone Encounter (Signed)
Starting on 05/11/19 pt has upper rt sided pain under the rt breast which radiates to back and up to rt shoulder. Pt said last night the pain also radiated into her rt neck. Pt  said the pain is a dull pain and now the pain level is 3-4. Pt said the pain worsens if she tries to lay on rt side, take a deep breath or move around. Pt has never had pain like this before, pt has had no known injuries and has not done any lifting or pulling. Pt has not changed her diet and has not eaten any spicy or greasy foods; pt does have her gall bladder. Pt has no covid symptoms, no travel and no known exposure to + covid. Pt will go to [facility] in [place]. FYI to Gentry Fitz NP.

## 2019-05-12 NOTE — Discharge Instructions (Signed)
Take the prescribed ibuprofen as needed for your pain.  Take the muscle relaxer Flexeril as needed for muscle spasm; do not drive, operate machinery, or drink alcohol with this medication as it may make you drowsy.    Go to the emergency department if you have increased pain or develop new symptoms such as fever, chills, chest pain, cough, shortness of breath, abdominal pain, vomiting, diarrhea, difficulty with urination, or other concerning symptoms.

## 2019-05-12 NOTE — Telephone Encounter (Signed)
I believe an appointment in our office was offered to her for tomorrow but she declined. I'm still happy to see her in the office. Vallarie Mare, will you check on her tomorrow (02/16)?

## 2019-05-12 NOTE — ED Provider Notes (Signed)
Roderic Palau    CSN: [id number] Arrival date & time: 05/12/19  1616      History   Chief Complaint Chief Complaint  Patient presents with  . RUQ abdominal pain    HPI Erika Henry is a 39 y.o. female.   Patient presents with right side pain since yesterday.  She states the pain is located at the bottom side of her rib cage just under her right breast; is intermittent, dull.  Currently 2/10 but was 8/10 last night when trying to sleep.  It is worse with lying on her right side and deep breaths; it improves with lying on her left side and rest.  The pain is reproducible with palpation of the area.  She denies fever, chills, abdominal pain, vomiting, diarrhea, dysuria, pelvic pain, cough, shortness of breath, rash, or other symptoms.  No treatments attempted at home.  The history is provided by the patient.    Past Medical History:  Diagnosis Date  . ASCUS with positive high risk HPV cervical 2004   neg colpo bx 2/04  . GERD (gastroesophageal reflux disease)   . Hypothyroid   . Lichen sclerosus   . Migraines   . PCOS (polycystic ovarian syndrome)   . Vaccine for human papilloma virus (HPV) types 6, 11, 16, and 18 administered     Patient Active Problem List   Diagnosis Date Noted  . Preventative health care 10/11/2018  . Vertigo 03/26/2018  . PCOS (polycystic ovarian syndrome) 02/09/2017  . Psoriasis 02/09/2017  . Hypothyroidism (acquired) 05/24/2016  . Vitamin D deficiency disease 05/24/2016  . Body mass index (BMI) of 45.0-49.9 in adult ([place]) 05/24/2016  . Gastro-esophageal reflux disease without esophagitis 08/02/2015    Past Surgical History:  Procedure Laterality Date  . COLPOSCOPY  2004  . WISDOM TOOTH EXTRACTION  2005    OB History   No obstetric history on file.      Home Medications    Prior to Admission medications   Medication Sig Start Date End Date Taking? Authorizing Provider  ARMOUR THYROID 120 MG tablet Take 120 mg by mouth  daily. 03/18/19  Yes [provider]  Cholecalciferol (VITAMIN D3) 2000 units capsule Take by mouth.   Yes [provider]  clobetasol (TEMOVATE) 0.05 % external solution Apply 1 application topically 2 (two) times daily. 10/11/18  Yes Pleas Koch, NP  meclizine (ANTIVERT) 25 MG tablet Take 1 tablet (25 mg total) by mouth 3 (three) times daily as needed for dizziness. 03/26/18  Yes Venia Carbon, MD  metformin (FORTAMET) 1000 MG (OSM) 24 hr tablet Take 1,000 mg by mouth 2 (two) times daily with a meal.   Yes [provider]  Multiple Vitamin (MULTI-VITAMINS) TABS Take by mouth.   Yes [provider]  spironolactone (ALDACTONE) 50 MG tablet Take 50 mg by mouth daily.  07/05/17  Yes [provider]  triamcinolone (KENALOG) 0.147 MG/GM topical spray Apply topically 2 (two) times daily. 02/13/17  Yes Pleas Koch, NP  ARMOUR THYROID 90 MG tablet Take 120 mg by mouth daily.  07/05/17   [provider]  cyclobenzaprine (FLEXERIL) 10 MG tablet Take 1 tablet (10 mg total) by mouth 2 (two) times daily as needed for muscle spasms. 05/12/19   Sharion Balloon, NP  ibuprofen (ADVIL) 800 MG tablet Take 1 tablet (800 mg total) by mouth every 8 (eight) hours as needed. 05/12/19   Sharion Balloon, NP    Family History Family  History  Problem Relation Age of Onset  . Hyperlipidemia Mother   . Hypertension Mother   . Hyperlipidemia Father   . Hypertension Father   . Prostate cancer Father   . Depression Brother   . Depression Maternal Grandmother   . Diabetes Maternal Grandmother   . Stroke Maternal Grandmother   . COPD Maternal Grandfather   . Diabetes Maternal Grandfather   . Stroke Maternal Grandfather   . Depression Paternal Grandmother   . Breast cancer Paternal Grandmother 47  . Arthritis Paternal Grandfather   . Cancer Paternal Grandfather   . Heart disease Paternal Grandfather   . Hyperlipidemia Paternal Grandfather   . Hypertension  Paternal Grandfather   . Kidney disease Paternal Grandfather     Social History Social History   Tobacco Use  . Smoking status: Never Smoker  . Smokeless tobacco: Never Used  Substance Use Topics  . Alcohol use: Yes    Comment: ocassional  . Drug use: Not Currently     Allergies   Patient has no known allergies.   Review of Systems Review of Systems  Constitutional: Negative for chills and fever.  HENT: Negative for congestion, ear pain, rhinorrhea and sore throat.   Eyes: Negative for pain and visual disturbance.  Respiratory: Negative for cough and shortness of breath.   Cardiovascular: Negative for chest pain and palpitations.  Gastrointestinal: Negative for abdominal pain, constipation, diarrhea, nausea and vomiting.  Genitourinary: Negative for dysuria, hematuria and pelvic pain.  Musculoskeletal: Positive for myalgias. Negative for arthralgias and back pain.  Skin: Negative for color change and rash.  Neurological: Negative for seizures and syncope.  All other systems reviewed and are negative.    Physical Exam Triage Vital Signs ED Triage Vitals  Enc Vitals Group     BP 05/12/19 1629 131/82     Pulse Rate 05/12/19 1629 75     Resp 05/12/19 1629 18     Temp 05/12/19 1629 98.7 F (37.1 C)     Temp Source 05/12/19 1629 Oral     SpO2 05/12/19 1629 96 %     Weight 05/12/19 1630 240 lb (108.9 kg)     Height 05/12/19 1630 5\' 2"  (1.575 m)     Head Circumference --      Peak Flow --      Pain Score 05/12/19 1629 4     Pain Loc --      Pain Edu? --      Excl. in [place]? --    No data found.  Updated Vital Signs BP 131/82 (BP Location: Right Arm)   Pulse 75   Temp 98.7 F (37.1 C) (Oral)   Resp 18   Ht 5\' 2"  (1.575 m)   Wt 240 lb (108.9 kg)   LMP 04/26/2019 (Exact Date)   SpO2 96%   BMI 43.90 kg/m   Visual Acuity Right Eye Distance:   Left Eye Distance:   Bilateral Distance:    Right Eye Near:   Left Eye Near:    Bilateral Near:     Physical  Exam Vitals and nursing note reviewed.  Constitutional:      General: She is not in acute distress.    Appearance: She is well-developed. She is not ill-appearing.  HENT:     Head: Normocephalic and atraumatic.     Mouth/Throat:     Mouth: Mucous membranes are moist.     Pharynx: Oropharynx is clear.  Eyes:     Conjunctiva/sclera: Conjunctivae normal.  Cardiovascular:     Rate and Rhythm: Normal rate and regular rhythm.     Heart sounds: No murmur.  Pulmonary:     Effort: Pulmonary effort is normal. No respiratory distress.     Breath sounds: Normal breath sounds. No wheezing or rhonchi.  Abdominal:     General: Bowel sounds are normal.     Palpations: Abdomen is soft.     Tenderness: There is no abdominal tenderness. There is no right CVA tenderness, left CVA tenderness, guarding or rebound.  Musculoskeletal:        General: Tenderness present. No swelling or deformity.     Cervical back: Neck supple.     Comments: Mild tenderness over right anterior and lateral ribcage.    Skin:    General: Skin is warm and dry.     Findings: No bruising, erythema, lesion or rash.  Neurological:     General: No focal deficit present.     Mental Status: She is alert and oriented to person, place, and time.     Gait: Gait normal.  Psychiatric:        Mood and Affect: Mood normal.        Behavior: Behavior normal.      UC Treatments / Results  Labs (all labs ordered are listed, but only abnormal results are displayed) Labs Reviewed - No data to display  EKG   Radiology No results found.  Procedures Procedures (including critical care time)  Medications Ordered in UC Medications - No data to display  Initial Impression / Assessment and Plan / UC Course  I have reviewed the triage vital signs and the nursing notes.  Pertinent labs & imaging results that were available during my care of the patient were reviewed by me and considered in my medical decision making (see chart for  details).   Costochondritis.  Treating with ibuprofen and Flexeril.  Precautions for drowsiness with Flexeril discussed with patient.  Instructed her to go to the ED if she has increased pain or develops new symptoms such as fever, chest pain, cough, shortness of breath, abdominal pain, severe vomiting/diarrhea, dysuria, or other concerns.  Patient agrees to plan of care.      Final Clinical Impressions(s) / UC Diagnoses   Final diagnoses:  Costochondral pain     Discharge Instructions     Take the prescribed ibuprofen as needed for your pain.  Take the muscle relaxer Flexeril as needed for muscle spasm; do not drive, operate machinery, or drink alcohol with this medication as it may make you drowsy.    Go to the emergency department if you have increased pain or develop new symptoms such as fever, chills, chest pain, cough, shortness of breath, abdominal pain, vomiting, diarrhea, difficulty with urination, or other concerning symptoms.        ED Prescriptions    Medication Sig Dispense Auth. Provider   ibuprofen (ADVIL) 800 MG tablet Take 1 tablet (800 mg total) by mouth every 8 (eight) hours as needed. 21 tablet Sharion Balloon, NP   cyclobenzaprine (FLEXERIL) 10 MG tablet Take 1 tablet (10 mg total) by mouth 2 (two) times daily as needed for muscle spasms. 20 tablet Sharion Balloon, NP     PDMP not reviewed this encounter.   Sharion Balloon, NP 05/12/19 [phone]

## 2019-05-13 NOTE — Telephone Encounter (Signed)
Spoken to patient and she did went to [facility] urgent care yesterday 05/12/2019. She was given ibuprofen 800 mg and cyclobenzaprine 10 mg for the muscle pain. So far patient is a little better. However, patient stated that would Anda Kraft be okay to write a work not for yesterday and today.

## 2019-05-13 NOTE — Telephone Encounter (Signed)
Work note sent to My Chart account.

## 2019-05-13 NOTE — Telephone Encounter (Signed)
Patient have been notified.  

## 2019-05-25 ENCOUNTER — Ambulatory Visit: Payer: 59 | Attending: Internal Medicine

## 2019-05-25 DIAGNOSIS — Z23 Encounter for immunization: Secondary | ICD-10-CM | POA: Insufficient documentation

## 2019-05-25 NOTE — Progress Notes (Signed)
   Covid-19 Vaccination Clinic  Name:  Erika Henry    MRN: [id number] DOB: 1980-07-09  05/25/2019  [name] was observed post Covid-19 immunization for 15 minutes without incidence. She was provided with Vaccine Information Sheet and instruction to access the V-Safe system.   [name] was instructed to call 911 with any severe reactions post vaccine: [name] Kitchen Difficulty breathing  . Swelling of your face and throat  . A fast heartbeat  . A bad rash all over your body  . Dizziness and weakness    Immunizations Administered    Name Date Dose VIS Date Route   Pfizer COVID-19 Vaccine 05/25/2019  9:02 AM 0.3 mL 03/07/2019 Intramuscular   Manufacturer: [place]   Lot: [id number]   [place]: [id number]

## 2019-06-05 DIAGNOSIS — H52223 Regular astigmatism, bilateral: Secondary | ICD-10-CM | POA: Diagnosis not present

## 2019-06-05 DIAGNOSIS — H5213 Myopia, bilateral: Secondary | ICD-10-CM | POA: Diagnosis not present

## 2019-06-05 IMAGING — US US THYROID
1 series · 14 of 22 positions shown · non-contrast
Comparison: Prior study at [HOSPITAL] on 03/13/2006.

CLINICAL DATA: Hypothyroid. History of goiter small thyroid isthmus
nodule.

EXAM:
THYROID ULTRASOUND
TECHNIQUE: Ultrasound examination of the thyroid gland and adjacent soft
tissues was performed.

[Series 1: us thyroid · 0.07mm/px · 14 of 22 slices shown]
[im 1/22]
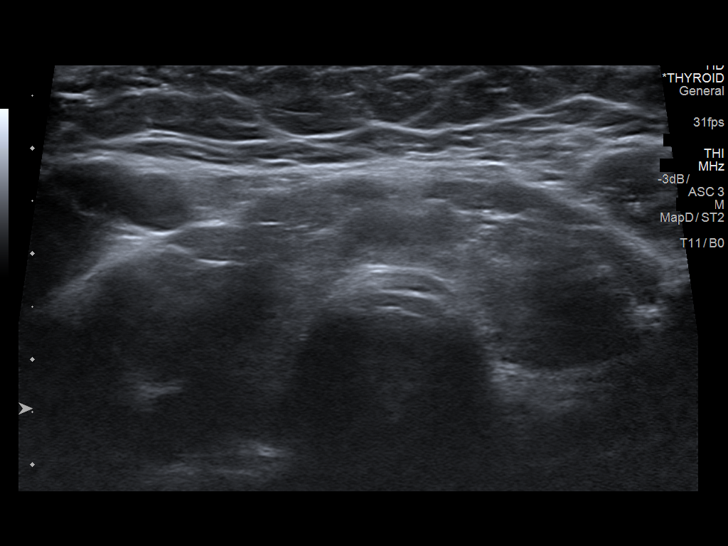
[im 3/22]
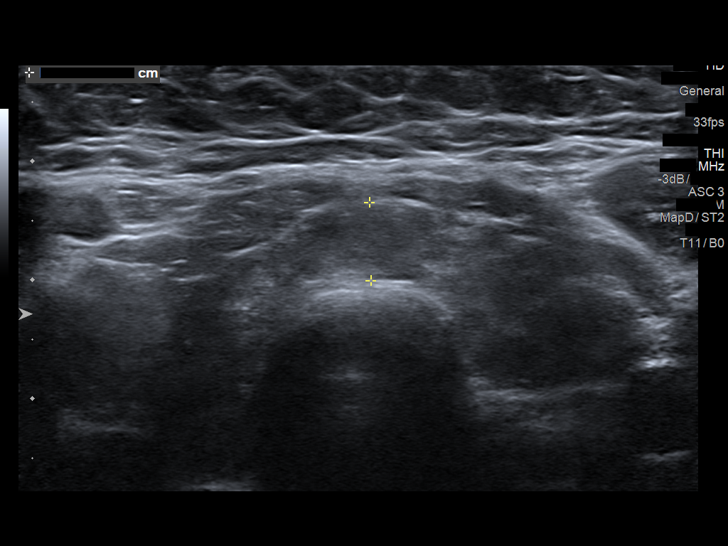
[im 4/22]
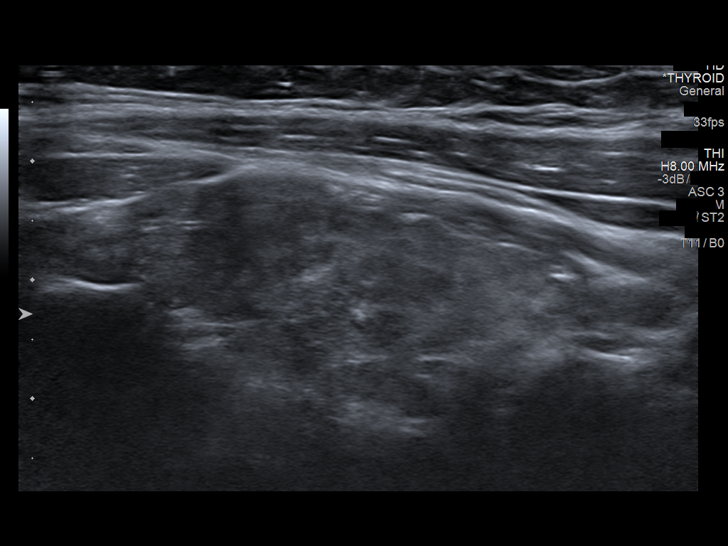
[im 6/22]
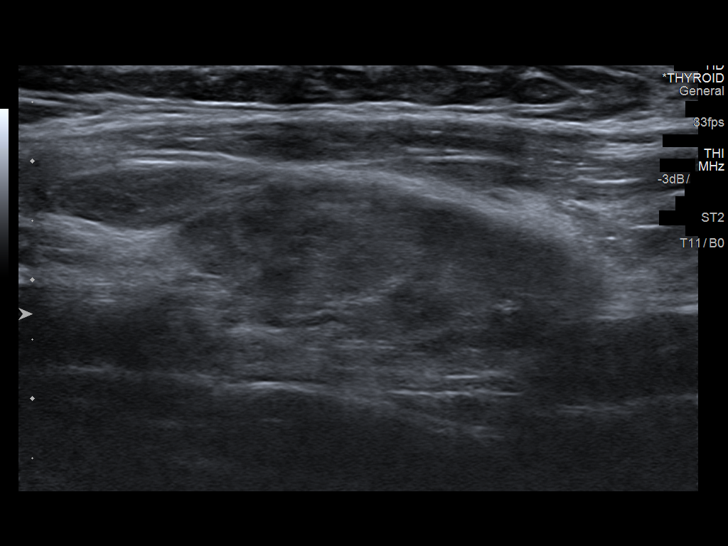
[im 8/22]
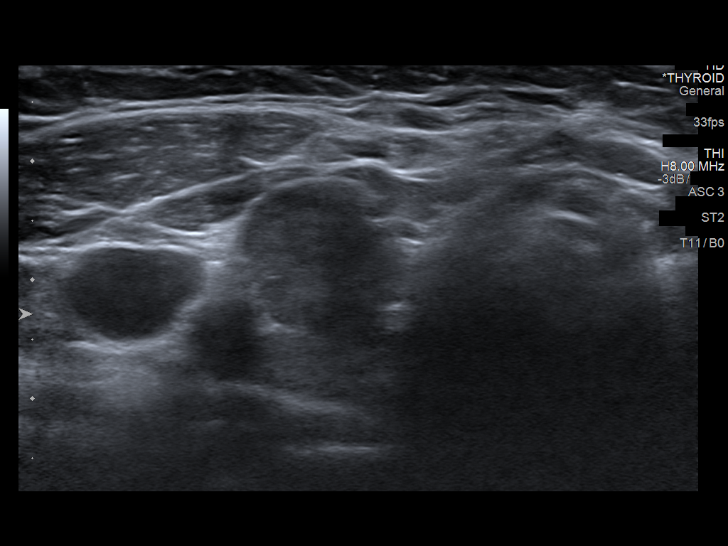
[im 9/22]
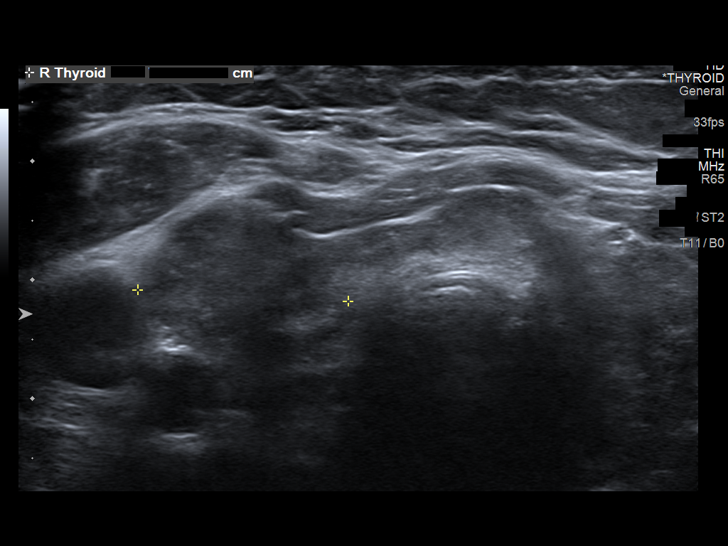
[im 11/22]
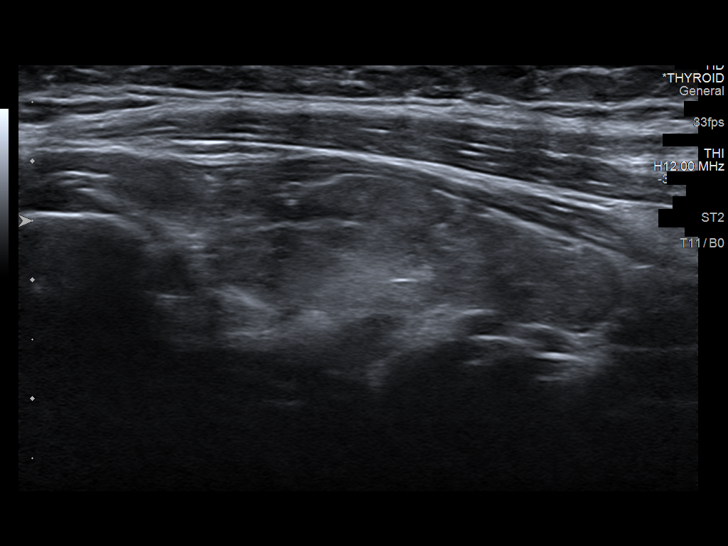
[im 12/22]
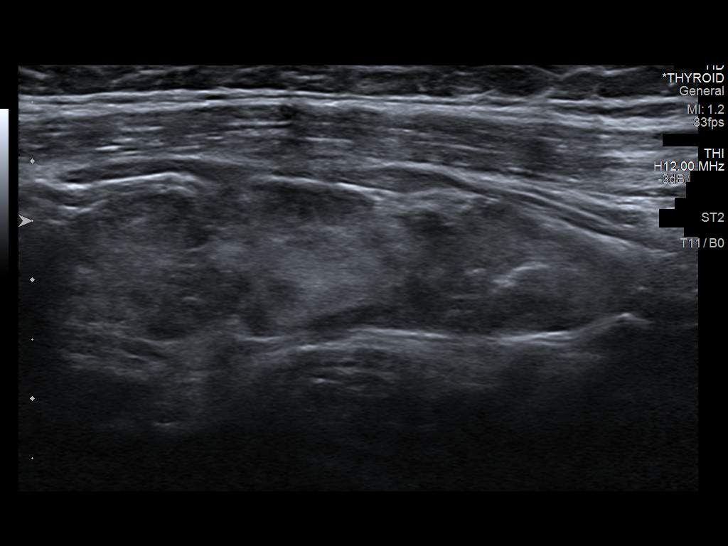
[im 14/22]
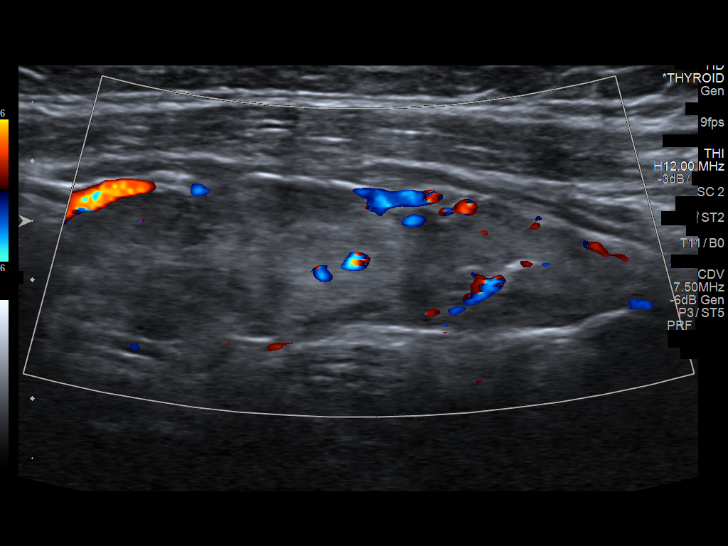
[im 15/22]
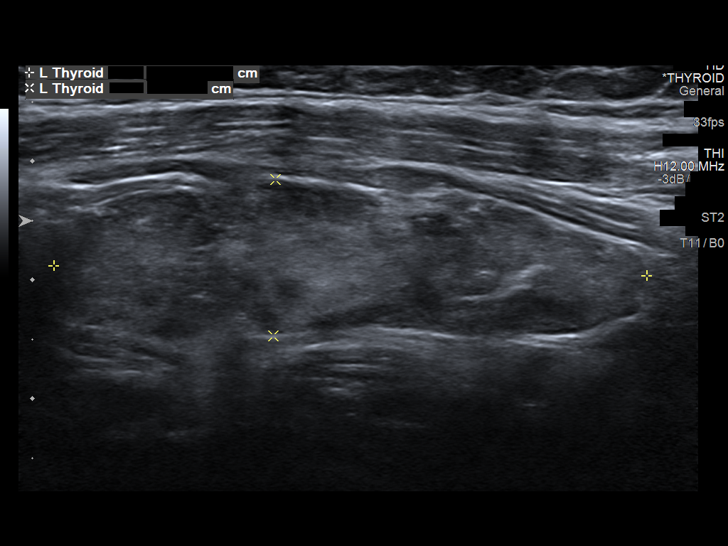
[im 17/22]
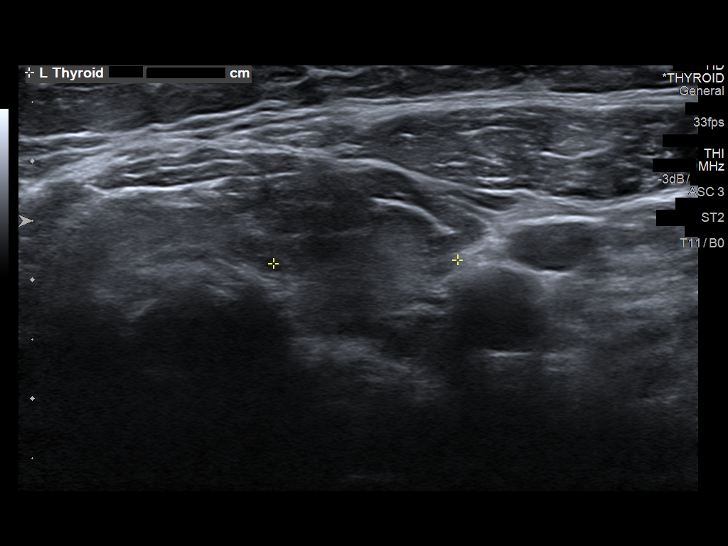
[im 19/22]
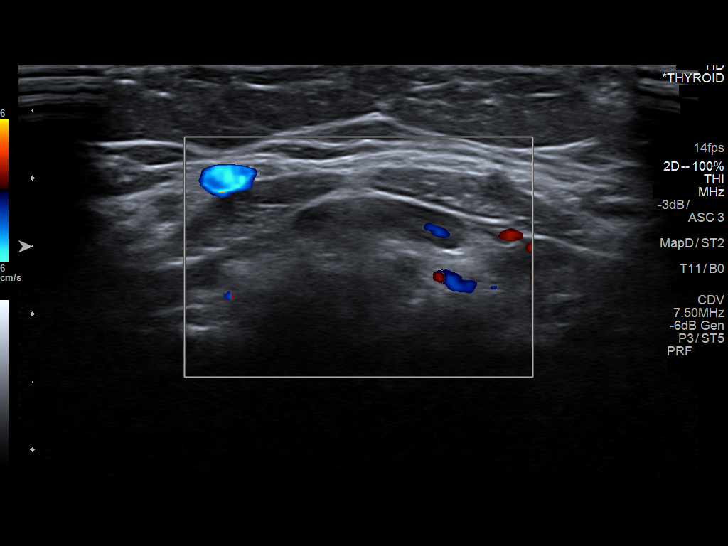
[im 20/22]
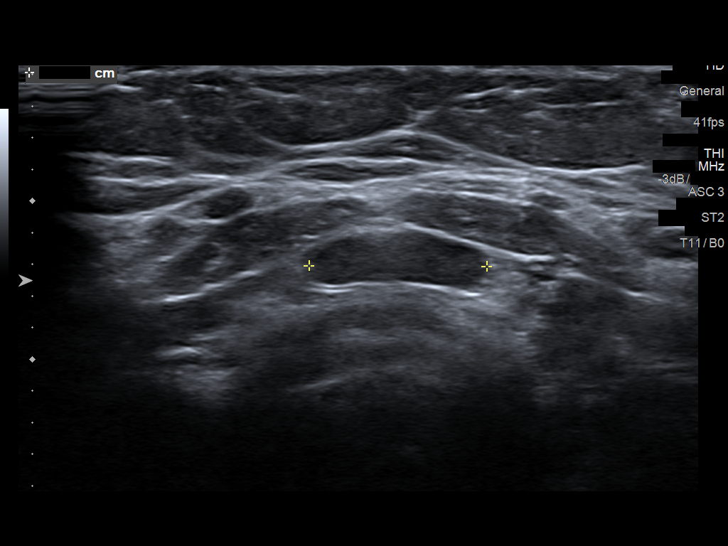
[im 22/22]
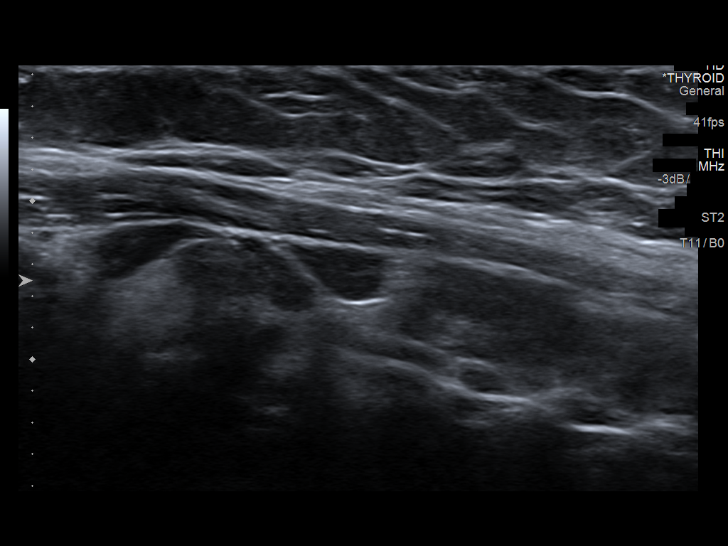

[14 of 22 positions shown; findings below may reference images not displayed]

FINDINGS: Parenchymal Echotexture: Markedly heterogenous

Isthmus: 0.7 cm

Right lobe: 4.3 x 2.1 x 1.8 cm

Left lobe: 5.0 x 1.3 x 1.6 cm

_________________________________________________________

Estimated total number of nodules >/= 1 cm: 0

Number of spongiform nodules >/=  2 cm not described below (TR1): 0

Number of mixed cystic and solid nodules >/= 1.5 cm not described
below (TR2): 0

_________________________________________________________

No discrete nodules are seen within the thyroid gland.
IMPRESSION: Heterogeneous thyroid gland without discrete nodules identified by
ultrasound. No further visualization of isthmus nodule.

The above is in keeping with the ACR TI-RADS recommendations - [HOSPITAL] 1760;[DATE].

## 2019-06-09 ENCOUNTER — Ambulatory Visit: Payer: 59 | Admitting: Internal Medicine

## 2019-06-16 ENCOUNTER — Ambulatory Visit: Payer: 59 | Attending: Internal Medicine

## 2019-06-16 DIAGNOSIS — Z23 Encounter for immunization: Secondary | ICD-10-CM

## 2019-06-16 NOTE — Progress Notes (Signed)
   Covid-19 Vaccination Clinic  Name:  Erika Henry    MRN: [id number] DOB: Aug 11, 1980  06/16/2019  Erika Henry was observed post Covid-19 immunization for 15 minutes without incident. She was provided with Vaccine Information Sheet and instruction to access the V-Safe system.   Erika Henry was instructed to call 911 with any severe reactions post vaccine: [name] Kitchen Difficulty breathing  . Swelling of face and throat  . A fast heartbeat  . A bad rash all over body  . Dizziness and weakness   Immunizations Administered    Name Date Dose VIS Date Route   Pfizer COVID-19 Vaccine 06/16/2019  9:00 AM 0.3 mL 03/07/2019 Intramuscular   Manufacturer: [place]   Lot: [id number]   [place]: [id number]

## 2019-07-08 DIAGNOSIS — E039 Hypothyroidism, unspecified: Secondary | ICD-10-CM

## 2019-07-08 MED ORDER — ARMOUR THYROID 120 MG PO TABS: ORAL_TABLET | ORAL | 0 refills | Status: DC

## 2019-07-16 ENCOUNTER — Ambulatory Visit: Payer: 59 | Admitting: Primary Care

## 2019-07-16 ENCOUNTER — Encounter: Payer: Self-pay | Admitting: Primary Care

## 2019-07-16 ENCOUNTER — Other Ambulatory Visit: Payer: Self-pay

## 2019-07-16 VITALS — BP 114/74 | HR 67 | Temp 96.8°F | Ht 62.0 in | Wt 252.5 lb

## 2019-07-16 DIAGNOSIS — E282 Polycystic ovarian syndrome: Secondary | ICD-10-CM

## 2019-07-16 DIAGNOSIS — E039 Hypothyroidism, unspecified: Secondary | ICD-10-CM | POA: Diagnosis not present

## 2019-07-16 DIAGNOSIS — E559 Vitamin D deficiency, unspecified: Secondary | ICD-10-CM

## 2019-07-16 NOTE — Patient Instructions (Signed)
Stop by the lab prior to leaving today. I will notify you of your results once received.   It was a pleasure to see you today!  

## 2019-07-16 NOTE — Assessment & Plan Note (Signed)
Taking Armour Thyroid correctly, repeat TSH pending. Consider switching to levothyroxine as she did well on this in the past.

## 2019-07-16 NOTE — Progress Notes (Signed)
Subjective:    Patient ID: Erika Henry, female    DOB: 1980/11/30, 39 y.o.   MRN: [id number]  HPI  This visit occurred during the SARS-CoV-2 public health emergency.  Safety protocols were in place, including screening questions prior to the visit, additional usage of staff PPE, and extensive cleaning of exam room while observing appropriate contact time as indicated for disinfecting solutions.   Erika Henry is a 39 year old female with a history of hypothyroidism, GERD, PCOS, vitamin d deficiency who presents today for follow up.  She is managed on Armour Thyroid 120 mg for which she's taken for the last year, has not been able to follow up since last year. She is taking her Armour Thyroid every morning with water only, no food or other medications for 30 minutes, no vitamins or other supplements for four hours. She has been on levothyroxine in the past, had no problems but was switched by an endocrinologist.   She is concerned today as her hair has been falling out for the last month. She will notice several strands coming out when running her hands through her hair, also with brushing.   She was managed on Metformin XR 1000 mg for PCOS but stopped this about 6 months ago due to headaches and diarrhea. Has also stopped taking spironolactone 50 mg around the same time.   She is taking vitamin D 4000 IU but often forgets and is inconsistent.   Review of Systems  Respiratory: Negative for shortness of breath.   Cardiovascular: Negative for chest pain and palpitations.  Gastrointestinal: Negative for constipation.  Endocrine: Negative for polydipsia and polyuria.       Past Medical History:  Diagnosis Date  . ASCUS with positive high risk HPV cervical 2004   neg colpo bx 2/04  . GERD (gastroesophageal reflux disease)   . Hypothyroid   . Lichen sclerosus   . Migraines   . PCOS (polycystic ovarian syndrome)   . Vaccine for human papilloma virus (HPV) types 6, 11, 16, and 18  administered      Social History   Socioeconomic History  . Marital status: Married    Spouse name: Not on file  . Number of children: Not on file  . Years of education: Not on file  . Highest education level: Not on file  Occupational History  . Not on file  Tobacco Use  . Smoking status: Never Smoker  . Smokeless tobacco: Never Used  Substance and Sexual Activity  . Alcohol use: Yes    Comment: ocassional  . Drug use: Not Currently  . Sexual activity: Yes  Other Topics Concern  . Not on file  Social History Narrative   Married.   No children.   Works as a Technical brewer at [employer].   Enjoys exercising, shopping, bowling, traveling.   Social Determinants of Health   Financial Resource Strain:   . Difficulty of Paying Living Expenses:   Food Insecurity:   . Worried About Charity fundraiser in the Last Year:   . Arboriculturist in the Last Year:   Transportation Needs:   . Film/video editor (Medical):   [name] Kitchen Lack of Transportation (Non-Medical):   Physical Activity:   . Days of Exercise per Week:   . Minutes of Exercise per Session:   Stress:   . Feeling of Stress :   Social Connections:   . Frequency of Communication with Friends and Family:   . Frequency  of Social Gatherings with Friends and Family:   . Attends Religious Services:   . Active Member of Clubs or Organizations:   . Attends Archivist Meetings:   [name] Kitchen Marital Status:   Intimate Partner Violence:   . Fear of Current or Ex-Partner:   . Emotionally Abused:   [name] Kitchen Physically Abused:   . Sexually Abused:     Past Surgical History:  Procedure Laterality Date  . COLPOSCOPY  2004  . WISDOM TOOTH EXTRACTION  2005    Family History  Problem Relation Age of Onset  . Hyperlipidemia Mother   . Hypertension Mother   . Hyperlipidemia Father   . Hypertension Father   . Prostate cancer Father   . Depression Brother   . Depression Maternal Grandmother   . Diabetes Maternal Grandmother   .  Stroke Maternal Grandmother   . COPD Maternal Grandfather   . Diabetes Maternal Grandfather   . Stroke Maternal Grandfather   . Depression Paternal Grandmother   . Breast cancer Paternal Grandmother 59  . Arthritis Paternal Grandfather   . Cancer Paternal Grandfather   . Heart disease Paternal Grandfather   . Hyperlipidemia Paternal Grandfather   . Hypertension Paternal Grandfather   . Kidney disease Paternal Grandfather     No Known Allergies  Current Outpatient Medications on File Prior to Visit  Medication Sig Dispense Refill  . ARMOUR THYROID 120 MG tablet Take 1 tablet by mouth every morning on an empty stomach with water only.  No food or other medications for 30 minutes. 14 tablet 0  . Cholecalciferol (VITAMIN D3) 2000 units capsule Take by mouth.    . clobetasol (TEMOVATE) 0.05 % external solution Apply 1 application topically 2 (two) times daily. 50 mL 0  . cyclobenzaprine (FLEXERIL) 10 MG tablet Take 1 tablet (10 mg total) by mouth 2 (two) times daily as needed for muscle spasms. 20 tablet 0  . Multiple Vitamin (MULTI-VITAMINS) TABS Take by mouth.     No current facility-administered medications on file prior to visit.    BP 114/74   Pulse 67   Temp (!) 96.8 F (36 C) (Temporal)   Ht 5\' 2"  (1.575 m)   Wt 252 lb 8 oz (114.5 kg)   LMP 06/08/2019   SpO2 96%   BMI 46.18 kg/m    Objective:   Physical Exam  Constitutional: She appears well-nourished.  Cardiovascular: Normal rate and regular rhythm.  Respiratory: Effort normal and breath sounds normal.  Musculoskeletal:     Cervical back: Neck supple.  Skin: Skin is warm and dry.  Psychiatric: She has a normal mood and affect.           Assessment & Plan:

## 2019-07-16 NOTE — Assessment & Plan Note (Signed)
No longer on Metformin due to GI upset, also not taking spironolactone. Repeat A1C pending.

## 2019-07-16 NOTE — Assessment & Plan Note (Signed)
Infrequently taking Vitamin D supplements. Repeat vitamin D pending.

## 2019-07-17 ENCOUNTER — Other Ambulatory Visit (INDEPENDENT_AMBULATORY_CARE_PROVIDER_SITE_OTHER): Payer: 59

## 2019-07-17 DIAGNOSIS — R7309 Other abnormal glucose: Secondary | ICD-10-CM

## 2019-07-17 DIAGNOSIS — E039 Hypothyroidism, unspecified: Secondary | ICD-10-CM

## 2019-07-17 LAB — CBC
HCT: 40.4 % (ref 36.0–46.0)
Hemoglobin: 13.6 g/dL (ref 12.0–15.0)
MCHC: 33.6 g/dL (ref 30.0–36.0)
MCV: 87.1 fl (ref 78.0–100.0)
Platelets: 402 10*3/uL — ABNORMAL HIGH (ref 150.0–400.0)
RBC: 4.64 Mil/uL (ref 3.87–5.11)
RDW: 13.7 % (ref 11.5–15.5)
WBC: 9 10*3/uL (ref 4.0–10.5)

## 2019-07-17 LAB — LIPID PANEL
Cholesterol: 168 mg/dL (ref 0–200)
HDL: 52.6 mg/dL (ref >39.00)
LDL Cholesterol: 97 mg/dL (ref 0–99)
NonHDL: 115.42
Total CHOL/HDL Ratio: 3
Triglycerides: 91 mg/dL (ref 0.0–149.0)
VLDL: 18.2 mg/dL (ref 0.0–40.0)

## 2019-07-17 LAB — COMPREHENSIVE METABOLIC PANEL
ALT: 28 U/L (ref 0–35)
AST: 23 U/L (ref 0–37)
Albumin: 4.2 g/dL (ref 3.5–5.2)
Alkaline Phosphatase: 79 U/L (ref 39–117)
BUN: 14 mg/dL (ref 6–23)
CO2: 28 mEq/L (ref 19–32)
Calcium: 9.5 mg/dL (ref 8.4–10.5)
Chloride: 103 mEq/L (ref 96–112)
Creatinine, Ser: 0.91 mg/dL (ref 0.40–1.20)
GFR: 68.53 mL/min (ref >60.00)
Glucose, Bld: 89 mg/dL (ref 70–99)
Potassium: 4 mEq/L (ref 3.5–5.1)
Sodium: 138 mEq/L (ref 135–145)
Total Bilirubin: 0.3 mg/dL (ref 0.2–1.2)
Total Protein: 7.4 g/dL (ref 6.0–8.3)

## 2019-07-17 LAB — VITAMIN D 25 HYDROXY (VIT D DEFICIENCY, FRACTURES): VITD: 27.1 ng/mL — ABNORMAL LOW (ref 30.00–100.00)

## 2019-07-17 LAB — HEMOGLOBIN A1C: Hgb A1c MFr Bld: 5.3 % (ref 4.6–6.5)

## 2019-07-17 LAB — TSH: TSH: 2.48 u[IU]/mL (ref 0.35–4.50)

## 2019-07-17 MED ORDER — LEVOTHYROXINE SODIUM 100 MCG PO TABS: ORAL_TABLET | ORAL | 0 refills | Status: DC

## 2019-08-15 NOTE — Telephone Encounter (Signed)
Not sure what you needed me to do with this message?

## 2019-08-15 NOTE — Telephone Encounter (Signed)
Sorry. Erika Henry to the wrong person.  [place], do know what patient is taking about?

## 2019-10-14 ENCOUNTER — Ambulatory Visit (INDEPENDENT_AMBULATORY_CARE_PROVIDER_SITE_OTHER): Payer: 59 | Admitting: Primary Care

## 2019-10-14 ENCOUNTER — Other Ambulatory Visit: Payer: Self-pay

## 2019-10-14 ENCOUNTER — Encounter: Payer: Self-pay | Admitting: Primary Care

## 2019-10-14 ENCOUNTER — Encounter: Payer: 59 | Admitting: Primary Care

## 2019-10-14 VITALS — BP 124/76 | HR 72 | Temp 95.5°F | Ht 62.0 in | Wt 245.5 lb

## 2019-10-14 DIAGNOSIS — E039 Hypothyroidism, unspecified: Secondary | ICD-10-CM | POA: Diagnosis not present

## 2019-10-14 DIAGNOSIS — E559 Vitamin D deficiency, unspecified: Secondary | ICD-10-CM | POA: Diagnosis not present

## 2019-10-14 DIAGNOSIS — Z1231 Encounter for screening mammogram for malignant neoplasm of breast: Secondary | ICD-10-CM

## 2019-10-14 DIAGNOSIS — L409 Psoriasis, unspecified: Secondary | ICD-10-CM

## 2019-10-14 DIAGNOSIS — K219 Gastro-esophageal reflux disease without esophagitis: Secondary | ICD-10-CM | POA: Diagnosis not present

## 2019-10-14 DIAGNOSIS — Z0000 Encounter for general adult medical examination without abnormal findings: Secondary | ICD-10-CM | POA: Diagnosis not present

## 2019-10-14 LAB — CBC
HCT: 42 % (ref 36.0–46.0)
Hemoglobin: 14.5 g/dL (ref 12.0–15.0)
MCHC: 34.6 g/dL (ref 30.0–36.0)
MCV: 86.5 fl (ref 78.0–100.0)
Platelets: 363 10*3/uL (ref 150.0–400.0)
RBC: 4.85 Mil/uL (ref 3.87–5.11)
RDW: 13.5 % (ref 11.5–15.5)
WBC: 6.7 10*3/uL (ref 4.0–10.5)

## 2019-10-14 LAB — COMPREHENSIVE METABOLIC PANEL
ALT: 29 U/L (ref 0–35)
AST: 21 U/L (ref 0–37)
Albumin: 4.1 g/dL (ref 3.5–5.2)
Alkaline Phosphatase: 70 U/L (ref 39–117)
BUN: 17 mg/dL (ref 6–23)
CO2: 27 mEq/L (ref 19–32)
Calcium: 9.3 mg/dL (ref 8.4–10.5)
Chloride: 103 mEq/L (ref 96–112)
Creatinine, Ser: 0.89 mg/dL (ref 0.40–1.20)
GFR: 70.22 mL/min (ref >60.00)
Glucose, Bld: 94 mg/dL (ref 70–99)
Potassium: 4.4 mEq/L (ref 3.5–5.1)
Sodium: 137 mEq/L (ref 135–145)
Total Bilirubin: 0.4 mg/dL (ref 0.2–1.2)
Total Protein: 7 g/dL (ref 6.0–8.3)

## 2019-10-14 LAB — LIPID PANEL
Cholesterol: 167 mg/dL (ref 0–200)
HDL: 43.2 mg/dL (ref >39.00)
LDL Cholesterol: 105 mg/dL — ABNORMAL HIGH (ref 0–99)
NonHDL: 123.67
Total CHOL/HDL Ratio: 4
Triglycerides: 94 mg/dL (ref 0.0–149.0)
VLDL: 18.8 mg/dL (ref 0.0–40.0)

## 2019-10-14 LAB — TSH: TSH: 3.3 u[IU]/mL (ref 0.35–4.50)

## 2019-10-14 NOTE — Assessment & Plan Note (Signed)
Overall improved with weight loss, infrequent use of OTC treatment.

## 2019-10-14 NOTE — Patient Instructions (Signed)
Stop by the lab prior to leaving today. I will notify you of your results once received.   Start exercising. You should be getting 150 minutes of moderate intensity exercise weekly.  Continue to work on a healthy diet. Ensure you are consuming 64 ounces of water daily.  Call the breast center to schedule your mammogram.   It was a pleasure to see you today!   Preventive Care 5-40 Years Old, Female Preventive care refers to visits with your health care provider and lifestyle choices that can promote health and wellness. This includes:  A yearly physical exam. This may also be called an annual well check.  Regular dental visits and eye exams.  Immunizations.  Screening for certain conditions.  Healthy lifestyle choices, such as eating a healthy diet, getting regular exercise, not using drugs or products that contain nicotine and tobacco, and limiting alcohol use. What can I expect for my preventive care visit? Physical exam Your health care provider will check your:  Height and weight. This may be used to calculate body mass index (BMI), which tells if you are at a healthy weight.  Heart rate and blood pressure.  Skin for abnormal spots. Counseling Your health care provider may ask you questions about your:  Alcohol, tobacco, and drug use.  Emotional well-being.  Home and relationship well-being.  Sexual activity.  Eating habits.  Work and work Statistician.  Method of birth control.  Menstrual cycle.  Pregnancy history. What immunizations do I need?  Influenza (flu) vaccine  This is recommended every year. Tetanus, diphtheria, and pertussis (Tdap) vaccine  You may need a Td booster every 10 years. Varicella (chickenpox) vaccine  You may need this if you have not been vaccinated. Zoster (shingles) vaccine  You may need this after age 30. Measles, mumps, and rubella (MMR) vaccine  You may need at least one dose of MMR if you were born in 1957 or later.  You may also need a second dose. Pneumococcal conjugate (PCV13) vaccine  You may need this if you have certain conditions and were not previously vaccinated. Pneumococcal polysaccharide (PPSV23) vaccine  You may need one or two doses if you smoke cigarettes or if you have certain conditions. Meningococcal conjugate (MenACWY) vaccine  You may need this if you have certain conditions. Hepatitis A vaccine  You may need this if you have certain conditions or if you travel or work in places where you may be exposed to hepatitis A. Hepatitis B vaccine  You may need this if you have certain conditions or if you travel or work in places where you may be exposed to hepatitis B. Haemophilus influenzae type b (Hib) vaccine  You may need this if you have certain conditions. Human papillomavirus (HPV) vaccine  If recommended by your health care provider, you may need three doses over 6 months. You may receive vaccines as individual doses or as more than one vaccine together in one shot (combination vaccines). Talk with your health care provider about the risks and benefits of combination vaccines. What tests do I need? Blood tests  Lipid and cholesterol levels. These may be checked every 5 years, or more frequently if you are over 74 years old.  Hepatitis C test.  Hepatitis B test. Screening  Lung cancer screening. You may have this screening every year starting at age 12 if you have a 30-pack-year history of smoking and currently smoke or have quit within the past 15 years.  Colorectal cancer screening. All adults should  have this screening starting at age 72 and continuing until age 52. Your health care provider may recommend screening at age 66 if you are at increased risk. You will have tests every 1-10 years, depending on your results and the type of screening test.  Diabetes screening. This is done by checking your blood sugar (glucose) after you have not eaten for a while (fasting).  You may have this done every 1-3 years.  Mammogram. This may be done every 1-2 years. Talk with your health care provider about when you should start having regular mammograms. This may depend on whether you have a family history of breast cancer.  BRCA-related cancer screening. This may be done if you have a family history of breast, ovarian, tubal, or peritoneal cancers.  Pelvic exam and Pap test. This may be done every 3 years starting at age 45. Starting at age 29, this may be done every 5 years if you have a Pap test in combination with an HPV test. Other tests  Sexually transmitted disease (STD) testing.  Bone density scan. This is done to screen for osteoporosis. You may have this scan if you are at high risk for osteoporosis. Follow these instructions at home: Eating and drinking  Eat a diet that includes fresh fruits and vegetables, whole grains, lean protein, and low-fat dairy.  Take vitamin and mineral supplements as recommended by your health care provider.  Do not drink alcohol if: ? Your health care provider tells you not to drink. ? You are pregnant, may be pregnant, or are planning to become pregnant.  If you drink alcohol: ? Limit how much you have to 0-1 drink a day. ? Be aware of how much alcohol is in your drink. In the [place] one drink equals one 12 oz bottle of beer (355 mL), one 5 oz glass of wine (148 mL), or one 1 oz glass of hard liquor (44 mL). Lifestyle  Take daily care of your teeth and gums.  Stay active. Exercise for at least 30 minutes on 5 or more days each week.  Do not use any products that contain nicotine or tobacco, such as cigarettes, e-cigarettes, and chewing tobacco. If you need help quitting, ask your health care provider.  If you are sexually active, practice safe sex. Use a condom or other form of birth control (contraception) in order to prevent pregnancy and STIs (sexually transmitted infections).  If told by your health care provider,  take low-dose aspirin daily starting at age 12. What's next?  Visit your health care provider once a year for a well check visit.  Ask your health care provider how often you should have your eyes and teeth checked.  Stay up to date on all vaccines. This information is not intended to replace advice given to you by your health care provider. Make sure you discuss any questions you have with your health care provider. Document Revised: 11/22/2017 Document Reviewed: 11/22/2017 Elsevier Patient Education  2020 [employer].

## 2019-10-14 NOTE — Assessment & Plan Note (Signed)
Complaint to vitamin D 4000 IU most nights of the week. Repeat vitamin D level pending.

## 2019-10-14 NOTE — Assessment & Plan Note (Signed)
She is taking levothyroxine correctly. Repeat TSH pending. 

## 2019-10-14 NOTE — Progress Notes (Signed)
Subjective:    Patient ID: Erika Henry, female    DOB: 29-Jun-1980, 39 y.o.   MRN: [id number]  HPI  This visit occurred during the SARS-CoV-2 public health emergency.  Safety protocols were in place, including screening questions prior to the visit, additional usage of staff PPE, and extensive cleaning of exam room while observing appropriate contact time as indicated for disinfecting solutions.   Erika Henry is a 39 year old female who presents today for complete physical.  Immunizations: -Tetanus: Unsure, she will check records -Influenza: Due this season  -Covid-19: Completed series   Diet: She endorses a fair diet, recently started the [employer]. She's cut out potatoes, bread, fried food. She's increased water intake.  Exercise: She is not exercising much now, just started increasing her activity.   Eye exam: Completed in 2021 Dental exam: Completed semiannually   Pap Smear: Completed in 2019 Mammogram: Never completed   BP Readings from Last 3 Encounters:  10/14/19 124/76  07/16/19 114/74  05/12/19 131/82   Wt Readings from Last 3 Encounters:  10/14/19 245 lb 8 oz (111.4 kg)  07/16/19 252 lb 8 oz (114.5 kg)  05/12/19 240 lb (108.9 kg)     Review of Systems  Constitutional: Negative for unexpected weight change.  HENT: Negative for rhinorrhea.   Respiratory: Negative for cough and shortness of breath.   Cardiovascular: Negative for chest pain.  Gastrointestinal: Negative for constipation and diarrhea.  Genitourinary: Negative for difficulty urinating and menstrual problem.  Musculoskeletal: Negative for arthralgias and myalgias.  Skin: Negative for rash.  Allergic/Immunologic: Negative for environmental allergies.  Neurological: Negative for dizziness, numbness and headaches.  Psychiatric/Behavioral: The patient is not nervous/anxious.        Past Medical History:  Diagnosis Date  . ASCUS with positive high risk HPV cervical 2004   neg colpo bx 2/04    . GERD (gastroesophageal reflux disease)   . Hypothyroid   . Lichen sclerosus   . Migraines   . PCOS (polycystic ovarian syndrome)   . Vaccine for human papilloma virus (HPV) types 6, 11, 16, and 18 administered      Social History   Socioeconomic History  . Marital status: Married    Spouse name: Not on file  . Number of children: Not on file  . Years of education: Not on file  . Highest education level: Not on file  Occupational History  . Not on file  Tobacco Use  . Smoking status: Never Smoker  . Smokeless tobacco: Never Used  Vaping Use  . Vaping Use: Never used  Substance and Sexual Activity  . Alcohol use: Yes    Comment: ocassional  . Drug use: Not Currently  . Sexual activity: Yes  Other Topics Concern  . Not on file  Social History Narrative   Married.   No children.   Works as a Technical brewer at [employer].   Enjoys exercising, shopping, bowling, traveling.   Social Determinants of Health   Financial Resource Strain:   . Difficulty of Paying Living Expenses:   Food Insecurity:   . Worried About Charity fundraiser in the Last Year:   . Arboriculturist in the Last Year:   Transportation Needs:   . Film/video editor (Medical):   [name] Kitchen Lack of Transportation (Non-Medical):   Physical Activity:   . Days of Exercise per Week:   . Minutes of Exercise per Session:   Stress:   . Feeling of Stress :  Social Connections:   . Frequency of Communication with Friends and Family:   . Frequency of Social Gatherings with Friends and Family:   . Attends Religious Services:   . Active Member of Clubs or Organizations:   . Attends Archivist Meetings:   [name] Kitchen Marital Status:   Intimate Partner Violence:   . Fear of Current or Ex-Partner:   . Emotionally Abused:   [name] Kitchen Physically Abused:   . Sexually Abused:     Past Surgical History:  Procedure Laterality Date  . COLPOSCOPY  2004  . WISDOM TOOTH EXTRACTION  2005    Family History  Problem  Relation Age of Onset  . Hyperlipidemia Mother   . Hypertension Mother   . Hyperlipidemia Father   . Hypertension Father   . Prostate cancer Father   . Depression Brother   . Depression Maternal Grandmother   . Diabetes Maternal Grandmother   . Stroke Maternal Grandmother   . COPD Maternal Grandfather   . Diabetes Maternal Grandfather   . Stroke Maternal Grandfather   . Depression Paternal Grandmother   . Breast cancer Paternal Grandmother 20  . Arthritis Paternal Grandfather   . Cancer Paternal Grandfather   . Heart disease Paternal Grandfather   . Hyperlipidemia Paternal Grandfather   . Hypertension Paternal Grandfather   . Kidney disease Paternal Grandfather     No Known Allergies  Current Outpatient Medications on File Prior to Visit  Medication Sig Dispense Refill  . Cholecalciferol (VITAMIN D3) 2000 units capsule Take by mouth.    . clobetasol (TEMOVATE) 0.05 % external solution Apply 1 application topically 2 (two) times daily. 50 mL 0  . levothyroxine (SYNTHROID) 100 MCG tablet Take 1 tablet by mouth every morning on an empty stomach with water only.  No food or other medications for 30 minutes. 90 tablet 0  . Multiple Vitamin (MULTI-VITAMINS) TABS Take by mouth.     No current facility-administered medications on file prior to visit.    BP 124/76   Pulse 72   Temp (!) 95.5 F (35.3 C) (Temporal)   Ht 5\' 2"  (1.575 m)   Wt 245 lb 8 oz (111.4 kg)   LMP 09/29/2019   SpO2 98%   BMI 44.90 kg/m    Objective:   Physical Exam HENT:     Right Ear: Tympanic membrane and ear canal normal.     Left Ear: Tympanic membrane and ear canal normal.  Eyes:     Pupils: Pupils are equal, round, and reactive to light.  Cardiovascular:     Rate and Rhythm: Normal rate and regular rhythm.  Pulmonary:     Effort: Pulmonary effort is normal.     Breath sounds: Normal breath sounds.  Abdominal:     General: Bowel sounds are normal.     Palpations: Abdomen is soft.      Tenderness: There is no abdominal tenderness.  Musculoskeletal:        General: Normal range of motion.     Cervical back: Neck supple.  Skin:    General: Skin is warm and dry.  Neurological:     Mental Status: She is alert and oriented to person, place, and time.     Cranial Nerves: No cranial nerve deficit.     Deep Tendon Reflexes:     Reflex Scores:      Patellar reflexes are 2+ on the right side and 2+ on the left side. Psychiatric:        Mood  and Affect: Mood normal.            Assessment & Plan:

## 2019-10-14 NOTE — Assessment & Plan Note (Signed)
Overall infrequent outbreaks, using clobetasol infrequently. Continue to monitor.

## 2019-10-14 NOTE — Assessment & Plan Note (Signed)
She will check on tetanus status. Other immunizations UTD. Pap smear UTD, due in 2022. Mammogram due, orders placed.  Commended her on changes in diet, encouraged to exercise. Exam today benign. Labs pending.

## 2019-10-23 ENCOUNTER — Other Ambulatory Visit: Payer: Self-pay

## 2019-10-23 ENCOUNTER — Other Ambulatory Visit: Payer: Self-pay | Admitting: Primary Care

## 2019-10-23 DIAGNOSIS — E039 Hypothyroidism, unspecified: Secondary | ICD-10-CM

## 2019-10-23 DIAGNOSIS — E559 Vitamin D deficiency, unspecified: Secondary | ICD-10-CM

## 2019-10-24 ENCOUNTER — Other Ambulatory Visit: Payer: Self-pay

## 2019-10-24 ENCOUNTER — Other Ambulatory Visit: Payer: Self-pay | Admitting: Primary Care

## 2019-10-24 DIAGNOSIS — E039 Hypothyroidism, unspecified: Secondary | ICD-10-CM

## 2019-10-24 MED ORDER — LEVOTHYROXINE SODIUM 100 MCG PO TABS: ORAL_TABLET | ORAL | 1 refills | Status: DC

## 2019-10-27 DIAGNOSIS — Z01419 Encounter for gynecological examination (general) (routine) without abnormal findings: Secondary | ICD-10-CM | POA: Diagnosis not present

## 2019-10-28 ENCOUNTER — Ambulatory Visit
Admission: RE | Admit: 2019-10-28 | Discharge: 2019-10-28 | Disposition: A | Discharge status: Home / Self Care | Payer: 59 | Source: Ambulatory Visit | Attending: Primary Care | Admitting: Primary Care

## 2019-10-28 ENCOUNTER — Other Ambulatory Visit: Payer: Self-pay

## 2019-10-28 DIAGNOSIS — Z1231 Encounter for screening mammogram for malignant neoplasm of breast: Secondary | ICD-10-CM | POA: Diagnosis not present

## 2019-12-30 ENCOUNTER — Other Ambulatory Visit: Payer: Self-pay

## 2019-12-30 ENCOUNTER — Other Ambulatory Visit: Payer: Self-pay | Admitting: Primary Care

## 2019-12-30 DIAGNOSIS — L409 Psoriasis, unspecified: Secondary | ICD-10-CM

## 2019-12-30 MED ORDER — CLOBETASOL PROPIONATE 0.05 % EX SOLN: 1.0000 "application " | Freq: Two times a day (BID) | CUTANEOUS | 0 refills | Status: DC

## 2020-02-04 DIAGNOSIS — Z1231 Encounter for screening mammogram for malignant neoplasm of breast: Secondary | ICD-10-CM | POA: Diagnosis not present

## 2020-03-02 ENCOUNTER — Ambulatory Visit (INDEPENDENT_AMBULATORY_CARE_PROVIDER_SITE_OTHER): Payer: 59 | Admitting: Dermatology

## 2020-03-02 ENCOUNTER — Other Ambulatory Visit: Payer: Self-pay

## 2020-03-02 DIAGNOSIS — D2239 Melanocytic nevi of other parts of face: Secondary | ICD-10-CM | POA: Diagnosis not present

## 2020-03-02 DIAGNOSIS — L814 Other melanin hyperpigmentation: Secondary | ICD-10-CM

## 2020-03-02 DIAGNOSIS — I8393 Asymptomatic varicose veins of bilateral lower extremities: Secondary | ICD-10-CM

## 2020-03-02 DIAGNOSIS — Z1283 Encounter for screening for malignant neoplasm of skin: Secondary | ICD-10-CM | POA: Diagnosis not present

## 2020-03-02 DIAGNOSIS — D239 Other benign neoplasm of skin, unspecified: Secondary | ICD-10-CM

## 2020-03-02 DIAGNOSIS — L821 Other seborrheic keratosis: Secondary | ICD-10-CM

## 2020-03-02 DIAGNOSIS — D2372 Other benign neoplasm of skin of left lower limb, including hip: Secondary | ICD-10-CM

## 2020-03-02 DIAGNOSIS — L905 Scar conditions and fibrosis of skin: Secondary | ICD-10-CM

## 2020-03-02 DIAGNOSIS — D229 Melanocytic nevi, unspecified: Secondary | ICD-10-CM

## 2020-03-02 DIAGNOSIS — L819 Disorder of pigmentation, unspecified: Secondary | ICD-10-CM | POA: Diagnosis not present

## 2020-03-02 DIAGNOSIS — L578 Other skin changes due to chronic exposure to nonionizing radiation: Secondary | ICD-10-CM

## 2020-03-02 DIAGNOSIS — D2261 Melanocytic nevi of right upper limb, including shoulder: Secondary | ICD-10-CM

## 2020-03-02 DIAGNOSIS — D1800 Hemangioma unspecified site: Secondary | ICD-10-CM

## 2020-03-02 NOTE — Patient Instructions (Signed)

## 2020-03-02 NOTE — Progress Notes (Signed)
New Patient Visit  Subjective  Erika Henry is a 39 y.o. female who presents for the following: TBSE (New patient here today for skin cancer screening and mole check. ).  Patient here for full body skin exam and skin cancer screening.  No personal history of skin cancer, patient does have a family history of skin cancer on father's side of the family.  There is a mole at left face that may have changed. There are also some spots by scalp.   The following portions of the chart were reviewed this encounter and updated as appropriate:      Review of Systems:  No other skin or systemic complaints except as noted in HPI or Assessment and Plan.  Objective  Well appearing patient in no apparent distress; mood and affect are within normal limits.  A full examination was performed including scalp, head, eyes, ears, nose, lips, neck, chest, axillae, abdomen, back, buttocks, bilateral upper extremities, bilateral lower extremities, hands, feet, fingers, toes, fingernails, and toenails. All findings within normal limits unless otherwise noted below.  Objective  Left mid cheek: 38mm light gray brown papule  Right Hand Dorsum: 42mm pink brown papule with darker center  Objective  Bilateral Posterior Shoulder: Hypopigmented macules  Objective  Bilateral Upper Arm: Post inflammatory hyperpigmentation, pink brown macules  Objective  Left lateral calf: Firm pink/brown papule nodule with dimple sign.   Objective  Bilateral Leg: Telangiectasias    Assessment & Plan  Nevus (2) Left mid cheek; Right Hand Dorsum  Benign-appearing.  Observation.  Call clinic for new or changing lesions.  Recommend daily use of broad spectrum spf 30+ sunscreen to sun-exposed areas.    Scar Bilateral Posterior Shoulder  Benign, observe.      Post-inflammatory pigmentary changes Bilateral Upper Arm  Benign, observe.  Recommend daily broad spectrum sunscreen SPF 30+ to sun-exposed areas,  reapply every 2 hours as needed. Call for new or changing lesions.   Dermatofibroma Left lateral calf  Benign, observe.    Spider veins of both lower extremities Bilateral Leg  Discussed sclerotherapy for spider vein treatment.  Discussed that it is a cosmetic procedure and is not covered by insurance ($350/treatment).  Treatment consists of injecting the spider veins with a medicine called Asclera to help them disappear.  Multiple treatments are generally necessary to get best results.  Risks including bruising and persistent discoloration due to post-inflammatory hyperpigmentation.  Sclerotherapy does not prevent the development of new spider veins.  Daily compression hose for two weeks after procedure is recommended.    Lentigines - Scattered tan macules - Discussed due to sun exposure - Benign, observe. Discussed BBL laser if treatment desired.  - Call for any changes  Seborrheic Keratoses - Stuck-on, waxy, tan-brown papules and plaques  - Discussed benign etiology and prognosis. - Observe - Call for any changes  Melanocytic Nevi - Tan-brown and/or pink-flesh-colored symmetric macules and papules at right forehead - Benign appearing on exam today - Observation - Call clinic for new or changing moles - Recommend daily use of broad spectrum spf 30+ sunscreen to sun-exposed areas.   Hemangiomas - Red papules - Discussed benign nature - Observe - Call for any changes  Actinic Damage - Chronic, secondary to cumulative UV/sun exposure, mild - diffuse scaly erythematous macules with underlying dyspigmentation - Recommend daily broad spectrum sunscreen SPF 30+ to sun-exposed areas, reapply every 2 hours as needed.  - Call for new or changing lesions.  Skin cancer screening performed today.  Return if symptoms worsen or fail to improve.  Graciella Belton, RMA, am acting as scribe for Brendolyn Patty, MD . Documentation: I have reviewed the above documentation for accuracy and  completeness, and I agree with the above.  Brendolyn Patty MD

## 2020-04-20 ENCOUNTER — Other Ambulatory Visit: Payer: Self-pay

## 2020-04-20 DIAGNOSIS — E039 Hypothyroidism, unspecified: Secondary | ICD-10-CM

## 2020-04-20 MED ORDER — LEVOTHYROXINE SODIUM 100 MCG PO TABS: ORAL_TABLET | ORAL | 1 refills | Status: DC

## 2020-04-27 ENCOUNTER — Other Ambulatory Visit: Payer: Self-pay

## 2020-04-27 ENCOUNTER — Ambulatory Visit: Payer: 59 | Admitting: Dermatology

## 2020-04-27 ENCOUNTER — Other Ambulatory Visit: Payer: Self-pay | Admitting: Dermatology

## 2020-04-27 DIAGNOSIS — L659 Nonscarring hair loss, unspecified: Secondary | ICD-10-CM | POA: Diagnosis not present

## 2020-04-27 DIAGNOSIS — Z8742 Personal history of other diseases of the female genital tract: Secondary | ICD-10-CM

## 2020-04-27 DIAGNOSIS — L309 Dermatitis, unspecified: Secondary | ICD-10-CM

## 2020-04-27 MED ORDER — TRIAMCINOLONE ACETONIDE 0.1 % EX CREA: 1.0000 "application " | TOPICAL_CREAM | Freq: Two times a day (BID) | CUTANEOUS | 3 refills | Status: DC | PRN

## 2020-04-27 NOTE — Progress Notes (Signed)
Follow-Up Visit   Subjective  Erika Henry is a 40 y.o. female who presents for the following: hair loss (Pt presents for hair loss of the scalp x 6 months, pt has thyroid problems controlled by Synthroid, no symptoms, shampoo daily with otc shampoo. //Hx of PCOS ).   The following portions of the chart were reviewed this encounter and updated as appropriate:   Tobacco  Allergies  Meds  Problems  Med Hx  Surg Hx  Fam Hx      Review of Systems:  No other skin or systemic complaints except as noted in HPI or Assessment and Plan.  Objective  Well appearing patient in no apparent distress; mood and affect are within normal limits.  A focused examination was performed including scalp . Relevant physical exam findings are noted in the Assessment and Plan.  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 Reviewed risk of facial hair growth, initially increase in shedding. Can try otc viviscal or Nutrafol supplement  May consider low dose po minoxidil at follow-up.   Other Related Procedures Vitamin D, 25-hydroxy Ferritin TSH  Dermatitis Neck - Anterior  Start triamcinolone 0.1% cream twice a day as needed to affected areas. Avoid applying to face, groin, and axilla. Use as directed. Risk of skin atrophy with long-term use reviewed.   Topical steroids (such as triamcinolone, fluocinolone, fluocinonide, mometasone, clobetasol, halobetasol, betamethasone, hydrocortisone) can cause thinning and lightening of the skin if they are used for too long in the same area. Your physician has selected the right strength  medicine for your problem and area affected on the body. Please use your medication only as directed by your physician to prevent side effects.     Gentle Skin Care Guide  1. Bathe no more than once a day.  2. Avoid bathing in hot water  3. Use a mild soap like Dove, Vanicream, Cetaphil, CeraVe. Can use Lever 2000 or Cetaphil antibacterial soap  4. Use soap only where you need it. On most days, use it under your arms, between your legs, and on your feet. Let the water rinse other areas unless visibly dirty.  5. When you get out of the bath/shower, use a towel to gently blot your skin dry, don't rub it.  6. While your skin is still a little damp, apply a moisturizing cream such as Vanicream, CeraVe, Cetaphil, Eucerin, Sarna lotion or plain Vaseline Jelly. For hands apply Neutrogena [place] Hand Cream or Excipial Hand Cream.  7. Reapply moisturizer any time you start to itch or feel dry.  8. Sometimes using free and clear laundry detergents can be helpful. Fabric softener sheets should be avoided. Downy Free & Gentle liquid, or any liquid fabric softener that is free of dyes and perfumes, it acceptable to use  9. If your doctor has given you prescription creams you may apply moisturizers over them      Return if symptoms worsen or fail to improve.  I, Erika Henry, CMA, am acting as scribe for Erika Gleason, MD .  Documentation: I have reviewed the above documentation for accuracy  and completeness, and I agree with the above.  Erika Gleason, MD

## 2020-04-27 NOTE — Patient Instructions (Addendum)
Telogen Effluvium  Telogen effluvium is a condition in which the body sheds much hair. People describe that they are losing hair "from the roots" in excessive amounts. The hair loss is usually 3 to 6 months following an event. The inciting event may be childbirth, a surgery, an illness with a fever, a traumatic psychological event, weight loss, or the start of a new medication. Some people have no known inciting event. For most people, no treatment is necessary, and the hair will stop falling out and begin to regrow with time. Some women develop a chronic form of telogen effluvium in which they continue to lose hair at an accelerated rate.   Gentle Skin Care Guide  1. Bathe no more than once a day.  2. Avoid bathing in hot water  3. Use a mild soap like Dove, Vanicream, Cetaphil, CeraVe. Can use Lever 2000 or Cetaphil antibacterial soap  4. Use soap only where you need it. On most days, use it under your arms, between your legs, and on your feet. Let the water rinse other areas unless visibly dirty.  5. When you get out of the bath/shower, use a towel to gently blot your skin dry, don't rub it.  6. While your skin is still a little damp, apply a moisturizing cream such as Vanicream, CeraVe, Cetaphil, Eucerin, Sarna lotion or plain Vaseline Jelly. For hands apply Neutrogena [place] Hand Cream or Excipial Hand Cream.  7. Reapply moisturizer any time you start to itch or feel dry.  8. Sometimes using free and clear laundry detergents can be helpful. Fabric softener sheets should be avoided. Downy Free & Gentle liquid, or any liquid fabric softener that is free of dyes and perfumes, it acceptable to use  9. If your doctor has given you prescription creams you may apply moisturizers over them

## 2020-04-28 ENCOUNTER — Encounter: Payer: Self-pay | Admitting: Dermatology

## 2020-05-12 ENCOUNTER — Telehealth: Payer: Self-pay | Admitting: Dermatology

## 2020-05-12 ENCOUNTER — Other Ambulatory Visit: Payer: Self-pay | Admitting: Dermatology

## 2020-05-12 MED ORDER — KETOCONAZOLE 2 % EX SHAM: MEDICATED_SHAMPOO | CUTANEOUS | 11 refills | Status: DC

## 2020-05-12 NOTE — Telephone Encounter (Signed)
Prescribed ketoconazole shampoo to help with androgenetic alopecia.

## 2020-06-04 ENCOUNTER — Ambulatory Visit: Payer: 59 | Admitting: Primary Care

## 2020-06-04 ENCOUNTER — Other Ambulatory Visit: Payer: Self-pay

## 2020-06-04 ENCOUNTER — Encounter: Payer: Self-pay | Admitting: Primary Care

## 2020-06-04 ENCOUNTER — Ambulatory Visit (INDEPENDENT_AMBULATORY_CARE_PROVIDER_SITE_OTHER)
Admission: RE | Admit: 2020-06-04 | Discharge: 2020-06-04 | Disposition: A | Discharge status: Home / Self Care | Payer: 59 | Source: Ambulatory Visit | Attending: Primary Care | Admitting: Primary Care

## 2020-06-04 DIAGNOSIS — M25362 Other instability, left knee: Secondary | ICD-10-CM | POA: Insufficient documentation

## 2020-06-04 NOTE — Assessment & Plan Note (Signed)
Acute for the last several weeks from the left knee down. No alarm signs in HPI or exam today.  Suspect symptoms are coming from the knee, likely due to long hours standing from work.   Checking plain films today. Discussed use of knee sleeve for support. Also recommended she see sports medicine for evaluation.

## 2020-06-04 NOTE — Patient Instructions (Signed)
Complete xray(s) prior to leaving today. I will notify you of your results once received.  Please consider using a knee sleeve/brace for support while at work.  Schedule a visit with Dr. Lorelei Pont as discussed.  It was a pleasure to see you today!

## 2020-06-04 NOTE — Progress Notes (Signed)
Subjective:    Patient ID: Erika Henry, female    DOB: Apr 02, 1980, 40 y.o.   MRN: [id number]  HPI  Erika Henry is a very pleasant 40 y.o. female with a history of GERD, hypothyroidism, PCOS, psoriasis who presents today with a chief complaint of lower extremity/knee instability.   She was walking around her neighborhood a few weeks ago, felt unstable to the left lower extremity, felt like she may fall. She felt like she was walking on ice, like she may slip. The sensation has been intermittent for the last few weeks, occurs from the knee down to her feet, has noticed aching to the left foot. She does notice bilateral ankle edema by the end of the day. Chronic bilateral knee pain, doesn't wear knee brace for support.   She does a lot of standing and walking while at work, 10 hours at a time, changed occupations last year.She's tried changing shoes without improvement. She denies pain, numbness, injury, abnormality to the right lower extremity, back pain.    She will notice her symptoms more so towards the end of the day at work. Doesn't notice symptoms in the morning when waking, doesn't notice symptoms on her days off from work or while at the gym.    Review of Systems  Skin: Negative for color change.  Neurological: Negative for weakness and numbness.       Left lower extremity instability          Past Medical History:  Diagnosis Date  . ASCUS with positive high risk HPV cervical 2004   neg colpo bx 2/04  . GERD (gastroesophageal reflux disease)   . Hypothyroid   . Lichen sclerosus   . Migraines   . PCOS (polycystic ovarian syndrome)   . Vaccine for human papilloma virus (HPV) types 6, 11, 16, and 18 administered     Social History   Socioeconomic History  . Marital status: Married    Spouse name: Not on file  . Number of children: Not on file  . Years of education: Not on file  . Highest education level: Not on file  Occupational History  . Not on file   Tobacco Use  . Smoking status: Never Smoker  . Smokeless tobacco: Never Used  Vaping Use  . Vaping Use: Never used  Substance and Sexual Activity  . Alcohol use: Yes    Comment: ocassional  . Drug use: Not Currently  . Sexual activity: Yes  Other Topics Concern  . Not on file  Social History Narrative   Married.   No children.   Works as a Technical brewer at [employer].   Enjoys exercising, shopping, bowling, traveling.   Social Determinants of Health   Financial Resource Strain: Not on file  Food Insecurity: Not on file  Transportation Needs: Not on file  Physical Activity: Not on file  Stress: Not on file  Social Connections: Not on file  Intimate Partner Violence: Not on file    Past Surgical History:  Procedure Laterality Date  . COLPOSCOPY  2004  . WISDOM TOOTH EXTRACTION  2005    Family History  Problem Relation Age of Onset  . Hyperlipidemia Mother   . Hypertension Mother   . Hyperlipidemia Father   . Hypertension Father   . Prostate cancer Father   . Depression Brother   . Depression Maternal Grandmother   . Diabetes Maternal Grandmother   . Stroke Maternal Grandmother   . COPD Maternal Grandfather   .  Diabetes Maternal Grandfather   . Stroke Maternal Grandfather   . Depression Paternal Grandmother   . Breast cancer Paternal Grandmother 33  . Arthritis Paternal Grandfather   . Cancer Paternal Grandfather   . Heart disease Paternal Grandfather   . Hyperlipidemia Paternal Grandfather   . Hypertension Paternal Grandfather   . Kidney disease Paternal Grandfather     No Known Allergies  Current Outpatient Medications on File Prior to Visit  Medication Sig Dispense Refill  . Cholecalciferol (VITAMIN D3) 2000 units capsule Take by mouth.    . clobetasol (TEMOVATE) 0.05 % external solution Apply 1 application topically 2 (two) times daily. 50 mL 0  . ketoconazole (NIZORAL) 2 % shampoo Use to shampoo scalp 3-7 times per week. Leave on for 5-10 minutes  before rinsing out. 120 mL 11  . levothyroxine (SYNTHROID) 100 MCG tablet Take 1 tablet by mouth every morning on an empty stomach with water only.  No food or other medications for 30 minutes. 90 tablet 1  . Multiple Vitamin (MULTI-VITAMINS) TABS Take by mouth.    . triamcinolone (KENALOG) 0.1 % Apply 1 application topically 2 (two) times daily as needed. 80 g 3   No current facility-administered medications on file prior to visit.    BP 120/72   Pulse 70   Temp (!) 97.5 F (36.4 C) (Temporal)   Ht 5\' 2"  (1.575 m)   Wt 259 lb 12 oz (117.8 kg)   SpO2 97%   BMI 47.51 kg/m  Objective:   Physical Exam Musculoskeletal:        General: No swelling or tenderness. Normal range of motion.     Right knee: Normal.     Left knee: Normal. No effusion or bony tenderness. Normal range of motion.     Comments: 5/5 strength to bilateral lower extremities. Ambulates well in office. Gets up and down from exam table without difficulty.           Assessment & Plan:      This visit occurred during the SARS-CoV-2 public health emergency.  Safety protocols were in place, including screening questions prior to the visit, additional usage of staff PPE, and extensive cleaning of exam room while observing appropriate contact time as indicated for disinfecting solutions.

## 2020-06-16 ENCOUNTER — Encounter: Payer: Self-pay | Admitting: Family Medicine

## 2020-06-16 ENCOUNTER — Ambulatory Visit: Payer: 59 | Admitting: Family Medicine

## 2020-06-16 ENCOUNTER — Other Ambulatory Visit: Payer: Self-pay | Admitting: Family Medicine

## 2020-06-16 ENCOUNTER — Other Ambulatory Visit: Payer: Self-pay

## 2020-06-16 VITALS — BP 106/70 | HR 79 | Temp 98.0°F | Ht 62.0 in | Wt 262.0 lb

## 2020-06-16 DIAGNOSIS — M722 Plantar fascial fibromatosis: Secondary | ICD-10-CM | POA: Diagnosis not present

## 2020-06-16 DIAGNOSIS — M25562 Pain in left knee: Secondary | ICD-10-CM | POA: Diagnosis not present

## 2020-06-16 DIAGNOSIS — L659 Nonscarring hair loss, unspecified: Secondary | ICD-10-CM | POA: Diagnosis not present

## 2020-06-16 DIAGNOSIS — Z6841 Body Mass Index (BMI) 40.0 and over, adult: Secondary | ICD-10-CM | POA: Diagnosis not present

## 2020-06-16 MED ORDER — DICLOFENAC SODIUM 75 MG PO TBEC: 75.0000 mg | DELAYED_RELEASE_TABLET | Freq: Two times a day (BID) | ORAL | 1 refills | Status: DC

## 2020-06-16 NOTE — Progress Notes (Signed)
Erika Henry Filter, MD, [place]  Primary Care and Sports Medicine [facility] at [facility] [place], [address]  Phone: [phone]  FAX: [phone]  Erika Henry - 40 y.o. female  MRN [id number]  Date of Birth: Apr 22, 1980  Date: 06/16/2020  PCP: Pleas Koch, NP  Referral: Pleas Koch, NP  Chief Complaint  Patient presents with  . Knee Pain    Left    This visit occurred during the SARS-CoV-2 public health emergency.  Safety protocols were in place, including screening questions prior to the visit, additional usage of staff PPE, and extensive cleaning of exam room while observing appropriate contact time as indicated for disinfecting solutions.   Subjective:   Erika Henry is a 39 y.o. very pleasant female patient with Body mass index is 47.92 kg/m. who presents with the following:  She is a pleasant young patient seen in consultation for my partner Erika Henry.  She has been having some knee pain and subjective instability over the last month or so.  No significant injury that is recalled.  No history of major trauma in the past and no surgery.  She has a sensation of instability while moving and walking at times.  I attempted to independently review her x-rays, but I was unable to make the PACS system work in the office.  On further questioning, there is really more pain on the plantar aspect of her foot in the midportion.  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  She does wear [place].  She denies any numbness or tingling or weakness.   No significant priors.  Within the last 2 months, she has started some HIIT classes.  Review of Systems is noted in the HPI, as appropriate   Objective:   BP 106/70   Pulse 79   Temp 98 F (36.7 C) (Temporal)   Ht 5\' 2"  (1.575 m)   Wt 262 lb (118.8  kg)   LMP 06/04/2020   SpO2 95%   BMI 47.92 kg/m    Knee:  L Gait: Normal heel toe pattern ROM: 0-130 Effusion: neg Echymosis or edema: none Patellar tendon NT Painful PLICA: neg Patellar grind: negative Medial and lateral patellar facet loading: negative medial and lateral joint lines:NT Mcmurray's neg Flexion-pinch neg Varus and valgus stress: stable Lachman: neg Ant and Post drawer: neg Hip abduction, IR, ER: WNL Hip flexion str: 5/5 Hip abd: 5/5 Quad: 5/5 VMO atrophy:No Hamstring concentric and eccentric: 5/5    FEET: L Echymosis: no Edema: no ROM: full LE B Gait: heel toe, non-antalgic MT pain: no Callus pattern: none Lateral Mall: NT Medial Mall: NT Talus: NT Navicular: NT Cuboid: NT Calcaneous: NT Metatarsals: NT 5th MT: NT Phalanges: NT Achilles: NT Plantar Fascia: TTP mid-portion Fat Pad: NT Peroneals: NT Post Tib: NT Great Toe: Nml motion Ant Drawer: neg ATFL: NT CFL: NT Deltoid: NT Hindfoot breakdown: none Sensation: intact    Proprioceptive testing is poor.  Radiology: DG Knee Complete 4 Views Left  Result Date: 06/06/2020 CLINICAL DATA:  Acute knee instability EXAM: LEFT KNEE - COMPLETE 4+ VIEW COMPARISON:  None. FINDINGS: No evidence of fracture, dislocation, or joint effusion. No evidence of arthropathy or other focal bone abnormality. Soft tissues are unremarkable. IMPRESSION: Negative. Electronically Signed   By: Rolm Baptise M.D.  On: 06/06/2020 11:12    Assessment and Plan:     ICD-10-CM   1. Plantar fascia syndrome  M72.2   2. Acute pain of left knee  M25.562   3. Body mass index (BMI) of 45.0-49.9 in adult [facility])  E28.00    I think that the primary issue here is plantar fascia syndrome on the left with some acute on chronic secondary left knee pain.  Working 10 hours a day on her feet certainly is part of the equation.  I do think that increased knee demand interval training recently contributes as well.  We talked about  some workout changes that I think will help.  We also reviewed some proprioception as well as intrinsic muscles of the foot training.  Weight is a risk factor.  She is working on losing some weight.  Meds ordered this encounter  Medications  . diclofenac (VOLTAREN) 75 MG EC tablet    Sig: Take 1 tablet (75 mg total) by mouth 2 (two) times daily.    Dispense:  60 tablet    Refill:  1   Follow-up as needed only  Signed,  Nicklous Aburto T. Quavon Keisling, MD   Outpatient Encounter Medications as of 06/16/2020  Medication Sig  . Cholecalciferol (VITAMIN D3) 2000 units capsule Take by mouth.  . clobetasol (TEMOVATE) 0.05 % external solution Apply 1 application topically 2 (two) times daily.  . diclofenac (VOLTAREN) 75 MG EC tablet Take 1 tablet (75 mg total) by mouth 2 (two) times daily.  [name] Kitchen ketoconazole (NIZORAL) 2 % shampoo Use to shampoo scalp 3-7 times per week. Leave on for 5-10 minutes before rinsing out.  [name] Kitchen levothyroxine (SYNTHROID) 100 MCG tablet Take 1 tablet by mouth every morning on an empty stomach with water only.  No food or other medications for 30 minutes.  . Multiple Vitamin (MULTI-VITAMINS) TABS Take by mouth.  . triamcinolone (KENALOG) 0.1 % Apply 1 application topically 2 (two) times daily as needed.   No facility-administered encounter medications on file as of 06/16/2020.

## 2020-06-17 LAB — FERRITIN: Ferritin: 60 ng/mL (ref 15–150)

## 2020-06-17 LAB — TSH: TSH: 5.2 u[IU]/mL — ABNORMAL HIGH (ref 0.450–4.500)

## 2020-06-17 LAB — VITAMIN D 25 HYDROXY (VIT D DEFICIENCY, FRACTURES): Vit D, 25-Hydroxy: 29.9 ng/mL — ABNORMAL LOW (ref 30.0–100.0)

## 2020-06-18 ENCOUNTER — Telehealth: Payer: Self-pay | Admitting: Primary Care

## 2020-06-18 ENCOUNTER — Other Ambulatory Visit: Payer: Self-pay

## 2020-06-18 DIAGNOSIS — E039 Hypothyroidism, unspecified: Secondary | ICD-10-CM

## 2020-06-18 NOTE — Telephone Encounter (Signed)
Called patient have made appointment and documented phone conversation in lab results.

## 2020-06-18 NOTE — Telephone Encounter (Signed)
Please call patient with lab results. EM

## 2020-06-21 ENCOUNTER — Other Ambulatory Visit (INDEPENDENT_AMBULATORY_CARE_PROVIDER_SITE_OTHER): Payer: 59

## 2020-06-21 ENCOUNTER — Other Ambulatory Visit: Payer: Self-pay

## 2020-06-21 DIAGNOSIS — E039 Hypothyroidism, unspecified: Secondary | ICD-10-CM | POA: Diagnosis not present

## 2020-06-21 DIAGNOSIS — H5213 Myopia, bilateral: Secondary | ICD-10-CM | POA: Diagnosis not present

## 2020-06-22 ENCOUNTER — Other Ambulatory Visit: Payer: Self-pay | Admitting: Primary Care

## 2020-06-22 DIAGNOSIS — E039 Hypothyroidism, unspecified: Secondary | ICD-10-CM

## 2020-06-22 LAB — TSH: TSH: 7.58 u[IU]/mL — ABNORMAL HIGH (ref 0.35–4.50)

## 2020-06-22 MED ORDER — LEVOTHYROXINE SODIUM 112 MCG PO TABS: ORAL_TABLET | ORAL | 0 refills | Status: DC

## 2020-06-22 NOTE — Telephone Encounter (Signed)
Per lab notes have spoke to patient.

## 2020-08-10 ENCOUNTER — Ambulatory Visit: Payer: 59 | Admitting: Primary Care

## 2020-08-10 ENCOUNTER — Telehealth: Payer: Self-pay

## 2020-08-10 ENCOUNTER — Encounter: Payer: Self-pay | Admitting: Primary Care

## 2020-08-10 ENCOUNTER — Other Ambulatory Visit: Payer: Self-pay

## 2020-08-10 VITALS — BP 138/78 | HR 101 | Temp 98.1°F | Ht 62.0 in | Wt 262.0 lb

## 2020-08-10 DIAGNOSIS — H6690 Otitis media, unspecified, unspecified ear: Secondary | ICD-10-CM | POA: Insufficient documentation

## 2020-08-10 MED ORDER — AMOXICILLIN-POT CLAVULANATE 875-125 MG PO TABS
1.0000 | ORAL_TABLET | Freq: Two times a day (BID) | ORAL | 0 refills | Status: DC
  Filled 2020-08-10: qty 14, 7d supply, fill #0

## 2020-08-10 NOTE — Assessment & Plan Note (Signed)
Early signs of what appear to be AOM noted.  Her exam on the left side appears abnormal compared to right.  Discussed options, will try Augmentin 875-125 mg BID x 7 days.   She will update in a few days.

## 2020-08-10 NOTE — Progress Notes (Signed)
Subjective:    Patient ID: Erika Henry, female    DOB: [birth year], 40 y.o.   MRN: [id number]  HPI  Erika Henry is a very pleasant 40 y.o. female who presents today with a chief complaint of otalgia.  Her pain is located to the left ear which began a few days ago but with a sensation of swelling and increased pain last night. Also with fullness to the left ear.   She denies rhinorrhea, fatigue, fevers, chills, cough, post nasal drip, symptoms to right ear. She's not been swimming. No changes in any medications.    Review of Systems  Constitutional: Negative for fever.  HENT: Positive for ear pain. Negative for congestion, postnasal drip, rhinorrhea, sinus pressure, sinus pain and sore throat.          Past Medical History:  Diagnosis Date  . ASCUS with positive high risk HPV cervical 2004   neg colpo bx 2/04  . GERD (gastroesophageal reflux disease)   . Hypothyroid   . Lichen sclerosus   . Migraines   . PCOS (polycystic ovarian syndrome)   . Vaccine for human papilloma virus (HPV) types 6, 11, 16, and 18 administered     Social History   Socioeconomic History  . Marital status: Married    Spouse name: Not on file  . Number of children: Not on file  . Years of education: Not on file  . Highest education level: Not on file  Occupational History  . Not on file  Tobacco Use  . Smoking status: Never Smoker  . Smokeless tobacco: Never Used  Vaping Use  . Vaping Use: Never used  Substance and Sexual Activity  . Alcohol use: Yes    Comment: ocassional  . Drug use: Not Currently  . Sexual activity: Yes  Other Topics Concern  . Not on file  Social History Narrative   Married.   No children.   Works as a Technical brewer at [employer].   Enjoys exercising, shopping, bowling, traveling.   Social Determinants of Health   Financial Resource Strain: Not on file  Food Insecurity: Not on file  Transportation Needs: Not on file  Physical Activity: Not on file   Stress: Not on file  Social Connections: Not on file  Intimate Partner Violence: Not on file    Past Surgical History:  Procedure Laterality Date  . COLPOSCOPY  2004  . WISDOM TOOTH EXTRACTION  2005    Family History  Problem Relation Age of Onset  . Hyperlipidemia Mother   . Hypertension Mother   . Hyperlipidemia Father   . Hypertension Father   . Prostate cancer Father   . Depression Brother   . Depression Maternal Grandmother   . Diabetes Maternal Grandmother   . Stroke Maternal Grandmother   . COPD Maternal Grandfather   . Diabetes Maternal Grandfather   . Stroke Maternal Grandfather   . Depression Paternal Grandmother   . Breast cancer Paternal Grandmother 26  . Arthritis Paternal Grandfather   . Cancer Paternal Grandfather   . Heart disease Paternal Grandfather   . Hyperlipidemia Paternal Grandfather   . Hypertension Paternal Grandfather   . Kidney disease Paternal Grandfather     No Known Allergies  Current Outpatient Medications on File Prior to Visit  Medication Sig Dispense Refill  . Cholecalciferol (VITAMIN D3) 2000 units capsule Take by mouth.    . clobetasol (TEMOVATE) 0.05 % external solution APPLY 1 APPLICATION TOPICALLY 2 (TWO) TIMES DAILY. 50 mL  0  . diclofenac (VOLTAREN) 75 MG EC tablet TAKE 1 TABLET BY MOUTH 2 TIMES DAILY. 60 tablet 1  . ketoconazole (NIZORAL) 2 % shampoo USE TO SHAMPOO SCALP 3-7 TIMES PER WEEK. LEAVE ON FOR 5-10 MINUTES BEFORE RINSING OUT. 120 mL 11  . levothyroxine (SYNTHROID) 112 MCG tablet TAKE 1 TABLET BY MOUTH EVERY MORNING ON AN EMPTY STOMACH WITH WATER ONLY. NO FOOD OR OTHER MEDICATIONS FOR 30 MINUTES. 90 tablet 0  . Multiple Vitamin (MULTI-VITAMINS) TABS Take by mouth.    . triamcinolone (KENALOG) 0.1 % APPLY 1 APPLICATION TOPICALLY 2 (TWO) TIMES DAILY AS NEEDED. 80 g 3   No current facility-administered medications on file prior to visit.    BP 138/78   Pulse (!) 101   Temp 98.1 F (36.7 C) (Temporal)   Ht 5\' 2"   (1.575 m)   Wt 262 lb (118.8 kg)   SpO2 97%   BMI 47.92 kg/m  Objective:   Physical Exam HENT:     Right Ear: Tympanic membrane and ear canal normal.     Left Ear: External ear normal. Tympanic membrane is erythematous and bulging.     Ears:     Comments: Mild erythema and mild bulging noted to left TM, different from right side.  Pulmonary:     Effort: Pulmonary effort is normal.  Neurological:     Mental Status: She is alert.           Assessment & Plan:      This visit occurred during the SARS-CoV-2 public health emergency.  Safety protocols were in place, including screening questions prior to the visit, additional usage of staff PPE, and extensive cleaning of exam room while observing appropriate contact time as indicated for disinfecting solutions.

## 2020-08-10 NOTE — Telephone Encounter (Signed)
Pt already has appt scheduled 08/10/20 at 3:20 with Gentry Fitz NP.

## 2020-08-10 NOTE — Telephone Encounter (Signed)
[facility] Night - Client TELEPHONE ADVICE RECORD [facility] Patient Name: Erika Henry LSON Gender: Female DOB: 07/26/1980 Age: 40 Y 10 M 25 D Return Phone Number: [phone] (Primary), [phone] (Secondary) [place] [address] Client [facility] Night - Client Client Site [place] Physician Alma Friendly - NP Contact Type Call Who Is Calling Patient / Member / Family / Caregiver Call Type Triage / Clinical Relationship To Patient Self Return Phone Number [phone] (Primary) Chief Complaint Earache Reason for Call Symptomatic / Request for Erika Henry states she has an earache and her ear is swollen. Translation No Nurse Assessment Nurse: Leilani Merl, RN, Heather Date/Time (Eastern Time): 08/10/2020 7:41:24 AM Confirm and document reason for call. If symptomatic, describe symptoms. ---Caller states she has an earache and her ear canal is swollen. She woke up with it this morning Does the patient have any new or worsening symptoms? ---Yes Will a triage be completed? ---Yes Related visit to physician within the last 2 weeks? ---No Does the PT have any chronic conditions? (i.e. diabetes, asthma, this includes High risk factors for pregnancy, etc.) ---Yes List chronic conditions. ---thyroid Is the patient pregnant or possibly pregnant? (Ask all females between the ages of 32-55) ---No Is this a behavioral health or substance abuse call? ---No Guidelines Guideline Title Affirmed Question Affirmed Notes Nurse Date/Time (Eastern Time) Earache Earache (Exceptions: brief ear pain of < 60 minutes duration, earache occurring during air travel Erika Henry, Erika, Nira Henry 08/10/2020 7:42:08 AM Disp. Time Erika Henry Time) Disposition Final User 08/10/2020 7:45:53 AM See PCP within 24 Hours Yes Standifer, RN, Nira Henry PLEASE  NOTE: All timestamps contained within this report are represented as [place] Standard Time. CONFIDENTIALTY NOTICE: This fax transmission is intended only for the addressee. It contains information that is legally privileged, confidential or otherwise protected from use or disclosure. If you are not the intended recipient, you are strictly prohibited from reviewing, disclosing, copying using or disseminating any of this information or taking any action in reliance on or regarding this information. If you have received this fax in error, please notify us immediately by telephone so that we can arrange for its return to [place]. Phone: [phone], Toll-Free: [phone], Fax: [phone] Page: 2 of 2 Call Id: [id number] Caller Disagree/Comply Comply Caller Understands Yes PreDisposition Call Doctor Care Advice Given Per Guideline SEE PCP WITHIN 24 HOURS: * IF OFFICE WILL BE OPEN: You need to be examined within the next 24 hours. Call your doctor (or NP/PA) when the office opens and make an appointment. * IBUPROFEN (E.G., MOTRIN, ADVIL): Take 400 mg (two 200 mg pills) by mouth every 6 hours. The most you should take each day is 1,200 mg (six 200 mg pills), unless your doctor has told you to take more. COLD PACK FOR EAR PAIN: * Apply a cold pack or a cold wet washcloth to outer ear for 20 minutes to reduce pain while medicine takes effect. Note: Some adults prefer local heat for 20 minutes. CALL BACK IF: * You become worse CARE ADVICE given per Earache (Adult) guideline. Referrals REFERRED TO PCP OFFICE

## 2020-08-10 NOTE — Patient Instructions (Signed)
Start Augmentin antibiotics for the infection Take 1 tablet by mouth twice daily for 7 days.  Please update me Friday this week.   It was a pleasure to see you today!

## 2020-09-06 ENCOUNTER — Other Ambulatory Visit: Payer: Self-pay

## 2020-09-06 MED ORDER — CLINDAMYCIN PHOSPHATE 1 % EX LOTN
TOPICAL_LOTION | CUTANEOUS | 6 refills | Status: DC
  Filled 2020-09-06 – 2020-09-14 (×2): qty 60, 30d supply, fill #0

## 2020-09-06 NOTE — Progress Notes (Signed)
Clindamycin lotion qd/bid aa face and neck prn flares./sh

## 2020-09-07 ENCOUNTER — Other Ambulatory Visit: Payer: Self-pay

## 2020-09-08 ENCOUNTER — Other Ambulatory Visit: Payer: Self-pay

## 2020-09-09 ENCOUNTER — Other Ambulatory Visit: Payer: Self-pay

## 2020-09-13 ENCOUNTER — Other Ambulatory Visit: Payer: Self-pay

## 2020-09-14 ENCOUNTER — Other Ambulatory Visit: Payer: Self-pay

## 2020-09-16 ENCOUNTER — Other Ambulatory Visit: Payer: Self-pay

## 2020-09-16 MED ORDER — CLINDAMYCIN PHOSPHATE 1 % EX SOLN
CUTANEOUS | 2 refills | Status: DC
  Filled 2020-09-16: qty 30, 20d supply, fill #0

## 2020-09-16 NOTE — Progress Notes (Signed)
RX change from lotion to solution due to coverage.

## 2020-09-17 ENCOUNTER — Other Ambulatory Visit: Payer: Self-pay

## 2020-09-20 ENCOUNTER — Other Ambulatory Visit: Payer: Self-pay | Admitting: Primary Care

## 2020-09-20 DIAGNOSIS — E039 Hypothyroidism, unspecified: Secondary | ICD-10-CM

## 2020-09-21 ENCOUNTER — Other Ambulatory Visit: Payer: Self-pay

## 2020-09-21 MED ORDER — LEVOTHYROXINE SODIUM 112 MCG PO TABS
ORAL_TABLET | ORAL | 0 refills | Status: DC
Start: 2020-09-21 — End: 2020-10-20
  Filled 2020-09-21: qty 30, 30d supply, fill #0

## 2020-09-22 ENCOUNTER — Other Ambulatory Visit: Payer: Self-pay

## 2020-10-20 ENCOUNTER — Other Ambulatory Visit: Payer: Self-pay

## 2020-10-20 ENCOUNTER — Ambulatory Visit (INDEPENDENT_AMBULATORY_CARE_PROVIDER_SITE_OTHER): Payer: 59 | Admitting: Primary Care

## 2020-10-20 ENCOUNTER — Encounter: Payer: Self-pay | Admitting: Primary Care

## 2020-10-20 VITALS — BP 104/62 | HR 84 | Temp 98.1°F | Ht 62.0 in | Wt 246.0 lb

## 2020-10-20 DIAGNOSIS — Z114 Encounter for screening for human immunodeficiency virus [HIV]: Secondary | ICD-10-CM

## 2020-10-20 DIAGNOSIS — E039 Hypothyroidism, unspecified: Secondary | ICD-10-CM | POA: Diagnosis not present

## 2020-10-20 DIAGNOSIS — Z1231 Encounter for screening mammogram for malignant neoplasm of breast: Secondary | ICD-10-CM | POA: Diagnosis not present

## 2020-10-20 DIAGNOSIS — K219 Gastro-esophageal reflux disease without esophagitis: Secondary | ICD-10-CM | POA: Diagnosis not present

## 2020-10-20 DIAGNOSIS — L409 Psoriasis, unspecified: Secondary | ICD-10-CM

## 2020-10-20 DIAGNOSIS — Z0000 Encounter for general adult medical examination without abnormal findings: Secondary | ICD-10-CM | POA: Diagnosis not present

## 2020-10-20 DIAGNOSIS — E559 Vitamin D deficiency, unspecified: Secondary | ICD-10-CM | POA: Diagnosis not present

## 2020-10-20 DIAGNOSIS — Z1159 Encounter for screening for other viral diseases: Secondary | ICD-10-CM | POA: Diagnosis not present

## 2020-10-20 LAB — LIPID PANEL
Cholesterol: 182 mg/dL (ref 0–200)
HDL: 43.7 mg/dL (ref >39.00)
LDL Cholesterol: 114 mg/dL — ABNORMAL HIGH (ref 0–99)
NonHDL: 138.4
Total CHOL/HDL Ratio: 4
Triglycerides: 123 mg/dL (ref 0.0–149.0)
VLDL: 24.6 mg/dL (ref 0.0–40.0)

## 2020-10-20 LAB — COMPREHENSIVE METABOLIC PANEL
ALT: 70 U/L — ABNORMAL HIGH (ref 0–35)
AST: 47 U/L — ABNORMAL HIGH (ref 0–37)
Albumin: 4.1 g/dL (ref 3.5–5.2)
Alkaline Phosphatase: 85 U/L (ref 39–117)
BUN: 15 mg/dL (ref 6–23)
CO2: 25 mEq/L (ref 19–32)
Calcium: 9.3 mg/dL (ref 8.4–10.5)
Chloride: 103 mEq/L (ref 96–112)
Creatinine, Ser: 0.76 mg/dL (ref 0.40–1.20)
GFR: 97.55 mL/min (ref >60.00)
Glucose, Bld: 88 mg/dL (ref 70–99)
Potassium: 4.5 mEq/L (ref 3.5–5.1)
Sodium: 137 mEq/L (ref 135–145)
Total Bilirubin: 0.5 mg/dL (ref 0.2–1.2)
Total Protein: 7 g/dL (ref 6.0–8.3)

## 2020-10-20 LAB — HEMOGLOBIN A1C: Hgb A1c MFr Bld: 5.4 % (ref 4.6–6.5)

## 2020-10-20 LAB — TSH: TSH: 3.69 u[IU]/mL (ref 0.35–5.50)

## 2020-10-20 MED ORDER — LEVOTHYROXINE SODIUM 112 MCG PO TABS
ORAL_TABLET | ORAL | 0 refills | Status: DC
Start: 2020-10-20 — End: 2020-10-21
  Filled 2020-10-20: qty 10, 10d supply, fill #0

## 2020-10-20 NOTE — Assessment & Plan Note (Signed)
Denies concerns, no use of OTC products.

## 2020-10-20 NOTE — Assessment & Plan Note (Signed)
She is taking levothyroxine correctly, continue levothyroxine 112 mcg. Repeat TSH pending.

## 2020-10-20 NOTE — Assessment & Plan Note (Signed)
Infrequent issues, using clobetasol PRN, continue to monitor. Follows with dermatology.

## 2020-10-20 NOTE — Assessment & Plan Note (Signed)
Immunizations UTD. Pap smear due, she will see GYN soon. Mammogram due, orders placed.   Discussed the importance of a healthy diet and regular exercise in order for weight loss, and to reduce the risk of further co-morbidity.  Exam today stable Labs pending.

## 2020-10-20 NOTE — Assessment & Plan Note (Signed)
Compliant to vitamin D 4000 IU daily, forgets often.

## 2020-10-20 NOTE — Progress Notes (Signed)
Subjective:    Patient ID: Erika Henry, female    DOB: [birth year], 40 y.o.   MRN: [id number]  HPI  Erika Henry is a very pleasant 40 y.o. female who presents today for complete physical and follow up of chronic conditions.  Immunizations: -Tetanus: 2013 -Influenza: Due this season  -Covid-19: 2 vaccines   Diet: Fair diet. Herbal life.  Exercise: Regular exercise, four days weekly.  Eye exam: Completes annually  Dental exam: Completes semi-annually   Pap Smear: Completed in July 2019, appointment scheduled with GYN Mammogram: Completed in August 2021  BP Readings from Last 3 Encounters:  10/20/20 104/62  08/10/20 138/78  06/16/20 106/70      Review of Systems  Constitutional:  Negative for unexpected weight change.  HENT:  Negative for rhinorrhea.   Respiratory:  Negative for shortness of breath.   Cardiovascular:  Negative for chest pain.  Gastrointestinal:  Negative for constipation and diarrhea.  Genitourinary:  Negative for difficulty urinating.  Musculoskeletal:  Negative for arthralgias and myalgias.  Skin:  Negative for rash.  Allergic/Immunologic: Negative for environmental allergies.  Neurological:  Negative for dizziness and headaches.  Psychiatric/Behavioral:  The patient is not nervous/anxious.         Past Medical History:  Diagnosis Date   ASCUS with positive high risk HPV cervical 2004   neg colpo bx 2/04   GERD (gastroesophageal reflux disease)    Hypothyroid    Lichen sclerosus    Migraines    PCOS (polycystic ovarian syndrome)    Vaccine for human papilloma virus (HPV) types 6, 11, 16, and 18 administered     Social History   Socioeconomic History   Marital status: Married    Spouse name: Not on file   Number of children: Not on file   Years of education: Not on file   Highest education level: Not on file  Occupational History   Not on file  Tobacco Use   Smoking status: Never   Smokeless tobacco: Never  Vaping Use    Vaping Use: Never used  Substance and Sexual Activity   Alcohol use: Yes    Comment: ocassional   Drug use: Not Currently   Sexual activity: Yes  Other Topics Concern   Not on file  Social History Narrative   Married.   No children.   Works as a Technical brewer at [employer].   Enjoys exercising, shopping, bowling, traveling.   Social Determinants of Health   Financial Resource Strain: Not on file  Food Insecurity: Not on file  Transportation Needs: Not on file  Physical Activity: Not on file  Stress: Not on file  Social Connections: Not on file  Intimate Partner Violence: Not on file    Past Surgical History:  Procedure Laterality Date   COLPOSCOPY  2004   WISDOM TOOTH EXTRACTION  2005    Family History  Problem Relation Age of Onset   Hyperlipidemia Mother    Hypertension Mother    Hyperlipidemia Father    Hypertension Father    Prostate cancer Father    Depression Brother    Depression Maternal Grandmother    Diabetes Maternal Grandmother    Stroke Maternal Grandmother    COPD Maternal Grandfather    Diabetes Maternal Grandfather    Stroke Maternal Grandfather    Depression Paternal Grandmother    Breast cancer Paternal Grandmother 87   Arthritis Paternal Grandfather    Cancer Paternal Grandfather    Heart disease Paternal Grandfather  Hyperlipidemia Paternal Grandfather    Hypertension Paternal Grandfather    Kidney disease Paternal Grandfather     No Known Allergies  Current Outpatient Medications on File Prior to Visit  Medication Sig Dispense Refill   Cholecalciferol (VITAMIN D3) 2000 units capsule Take by mouth.     clindamycin (CLEOCIN T) 1 % external solution Apply topically as directed. Apply once to twice daily to affected areas on neck and face as needed for flares 30 mL 2   clobetasol (TEMOVATE) 0.05 % external solution APPLY 1 APPLICATION TOPICALLY 2 (TWO) TIMES DAILY. 50 mL 0   ketoconazole (NIZORAL) 2 % shampoo USE TO SHAMPOO SCALP 3-7  TIMES PER WEEK. LEAVE ON FOR 5-10 MINUTES BEFORE RINSING OUT. 120 mL 11   levothyroxine (SYNTHROID) 112 MCG tablet Take 1 tablet by mouth every morning on an empty stomach with water only.  No food or other medications for 30 minutes. 30 tablet 0   Multiple Vitamin (MULTI-VITAMINS) TABS Take by mouth.     triamcinolone (KENALOG) 0.1 % APPLY 1 APPLICATION TOPICALLY 2 (TWO) TIMES DAILY AS NEEDED. 80 g 3   diclofenac (VOLTAREN) 75 MG EC tablet TAKE 1 TABLET BY MOUTH 2 TIMES DAILY. (Patient not taking: Reported on 10/20/2020) 60 tablet 1   No current facility-administered medications on file prior to visit.    BP 104/62   Pulse 84   Temp 98.1 F (36.7 C) (Temporal)   Ht '5\' 2"'$  (1.575 m)   Wt 246 lb (111.6 kg)   SpO2 97%   BMI 44.99 kg/m  Objective:   Physical Exam HENT:     Right Ear: Tympanic membrane and ear canal normal.     Left Ear: Tympanic membrane and ear canal normal.     Nose: Nose normal.  Eyes:     Conjunctiva/sclera: Conjunctivae normal.     Pupils: Pupils are equal, round, and reactive to light.  Neck:     Thyroid: No thyromegaly.  Cardiovascular:     Rate and Rhythm: Normal rate and regular rhythm.     Heart sounds: No murmur heard. Pulmonary:     Effort: Pulmonary effort is normal.     Breath sounds: Normal breath sounds. No rales.  Abdominal:     General: Bowel sounds are normal.     Palpations: Abdomen is soft.     Tenderness: There is no abdominal tenderness.  Musculoskeletal:        General: Normal range of motion.     Cervical back: Neck supple.  Lymphadenopathy:     Cervical: No cervical adenopathy.  Skin:    General: Skin is warm and dry.     Findings: No rash.  Neurological:     Mental Status: She is alert and oriented to person, place, and time.     Cranial Nerves: No cranial nerve deficit.     Deep Tendon Reflexes: Reflexes are normal and symmetric.  Psychiatric:        Mood and Affect: Mood normal.          Assessment & Plan:       This visit occurred during the SARS-CoV-2 public health emergency.  Safety protocols were in place, including screening questions prior to the visit, additional usage of staff PPE, and extensive cleaning of exam room while observing appropriate contact time as indicated for disinfecting solutions.

## 2020-10-20 NOTE — Patient Instructions (Signed)
Stop by the lab prior to leaving today. I will notify you of your results once received.   [facility] to schedule your mammogram.   It was a pleasure to see you today!  Preventive Care 19-41 Years Old, Female Preventive care refers to lifestyle choices and visits with your health care provider that can promote health and wellness. This includes: A yearly physical exam. This is also called an annual wellness visit. Regular dental and eye exams. Immunizations. Screening for certain conditions. Healthy lifestyle choices, such as: Eating a healthy diet. Getting regular exercise. Not using drugs or products that contain nicotine and tobacco. Limiting alcohol use. What can I expect for my preventive care visit? Physical exam Your health care provider will check your: Height and weight. These may be used to calculate your BMI (body mass index). BMI is a measurement that tells if you are at a healthy weight. Heart rate and blood pressure. Body temperature. Skin for abnormal spots. Counseling Your health care provider may ask you questions about your: Past medical problems. Family's medical history. Alcohol, tobacco, and drug use. Emotional well-being. Home life and relationship well-being. Sexual activity. Diet, exercise, and sleep habits. Work and work Statistician. Access to firearms. Method of birth control. Menstrual cycle. Pregnancy history. What immunizations do I need?  Vaccines are usually given at various ages, according to a schedule. Your health care provider will recommend vaccines for you based on your age, medicalhistory, and lifestyle or other factors, such as travel or where you work. What tests do I need? Blood tests Lipid and cholesterol levels. These may be checked every 5 years, or more often if you are over 82 years old. Hepatitis C test. Hepatitis B test. Screening Lung cancer screening. You may have this screening every year starting at age 79  if you have a 30-pack-year history of smoking and currently smoke or have quit within the past 15 years. Colorectal cancer screening. All adults should have this screening starting at age 48 and continuing until age 71. Your health care provider may recommend screening at age 56 if you are at increased risk. You will have tests every 1-10 years, depending on your results and the type of screening test. Diabetes screening. This is done by checking your blood sugar (glucose) after you have not eaten for a while (fasting). You may have this done every 1-3 years. Mammogram. This may be done every 1-2 years. Talk with your health care provider about when you should start having regular mammograms. This may depend on whether you have a family history of breast cancer. BRCA-related cancer screening. This may be done if you have a family history of breast, ovarian, tubal, or peritoneal cancers. Pelvic exam and Pap test. This may be done every 3 years starting at age 67. Starting at age 74, this may be done every 5 years if you have a Pap test in combination with an HPV test. Other tests STD (sexually transmitted disease) testing, if you are at risk. Bone density scan. This is done to screen for osteoporosis. You may have this scan if you are at high risk for osteoporosis. Talk with your health care provider about your test results, treatment options,and if necessary, the need for more tests. Follow these instructions at home: Eating and drinking  Eat a diet that includes fresh fruits and vegetables, whole grains, lean protein, and low-fat dairy products. Take vitamin and mineral supplements as recommended by your health care provider. Do not drink alcohol  if: Your health care provider tells you not to drink. You are pregnant, may be pregnant, or are planning to become pregnant. If you drink alcohol: Limit how much you have to 0-1 drink a day. Be aware of how much alcohol is in your drink. In the  [place] one drink equals one 12 oz bottle of beer (355 mL), one 5 oz glass of wine (148 mL), or one 1 oz glass of hard liquor (44 mL).  Lifestyle Take daily care of your teeth and gums. Brush your teeth every morning and night with fluoride toothpaste. Floss one time each day. Stay active. Exercise for at least 30 minutes 5 or more days each week. Do not use any products that contain nicotine or tobacco, such as cigarettes, e-cigarettes, and chewing tobacco. If you need help quitting, ask your health care provider. Do not use drugs. If you are sexually active, practice safe sex. Use a condom or other form of protection to prevent STIs (sexually transmitted infections). If you do not wish to become pregnant, use a form of birth control. If you plan to become pregnant, see your health care provider for a prepregnancy visit. If told by your health care provider, take low-dose aspirin daily starting at age 75. Find healthy ways to cope with stress, such as: Meditation, yoga, or listening to music. Journaling. Talking to a trusted person. Spending time with friends and family. Safety Always wear your seat belt while driving or riding in a vehicle. Do not drive: If you have been drinking alcohol. Do not ride with someone who has been drinking. When you are tired or distracted. While texting. Wear a helmet and other protective equipment during sports activities. If you have firearms in your house, make sure you follow all gun safety procedures. What's next? Visit your health care provider once a year for an annual wellness visit. Ask your health care provider how often you should have your eyes and teeth checked. Stay up to date on all vaccines. This information is not intended to replace advice given to you by your health care provider. Make sure you discuss any questions you have with your healthcare provider. Document Revised: 12/16/2019 Document Reviewed: 11/22/2017 Elsevier Patient  Education  2022 [employer].

## 2020-10-21 ENCOUNTER — Other Ambulatory Visit: Payer: Self-pay | Admitting: Primary Care

## 2020-10-21 DIAGNOSIS — R7989 Other specified abnormal findings of blood chemistry: Secondary | ICD-10-CM

## 2020-10-21 DIAGNOSIS — E039 Hypothyroidism, unspecified: Secondary | ICD-10-CM

## 2020-10-21 LAB — HEPATITIS C ANTIBODY
Hepatitis C Ab: NONREACTIVE
SIGNAL TO CUT-OFF: 0.02 (ref <1.00)

## 2020-10-21 LAB — HIV ANTIBODY (ROUTINE TESTING W REFLEX): HIV 1&2 Ab, 4th Generation: NONREACTIVE

## 2020-10-21 MED ORDER — LEVOTHYROXINE SODIUM 112 MCG PO TABS
ORAL_TABLET | ORAL | 1 refills | Status: DC
  Filled 2020-10-21: qty 90, 90d supply, fill #0
  Filled 2021-01-31: qty 90, 90d supply, fill #1

## 2020-10-22 ENCOUNTER — Other Ambulatory Visit: Payer: Self-pay

## 2020-10-28 ENCOUNTER — Other Ambulatory Visit: Payer: Self-pay

## 2020-11-18 ENCOUNTER — Ambulatory Visit: Payer: 59 | Admitting: Obstetrics and Gynecology

## 2020-12-15 ENCOUNTER — Ambulatory Visit: Payer: 59 | Admitting: Obstetrics and Gynecology

## 2021-01-18 NOTE — Progress Notes (Signed)
PCP:  Pleas Koch, NP   Chief Complaint  Patient presents with   Gynecologic Exam    BTB between cycles x few months, cycles lasting longer than usual     HPI:      [name] is a 40 y.o. No obstetric history on file. who LMP was Patient's last menstrual period was 01/05/2021 (approximate)., presents today for her NP > 3 yrs annual examination.  Her menses are monthly, lasting 7 days, med flow.  Dysmenorrhea mild, occurring first 1-2 days of flow. She does not usually have intermenstrual bleeding, but has had it in distant past and now again the past few cycles, light spotting for a wk (not this past cycle, however).  Hx of PCOS.  OCPs in past raised BP. Hx of leio per pt (u/s done by Dr. Ronnald Collum in 2015 was neg for [facility]). Normal TSH 7/22.  Sex activity: single partner, contraception - none/condoms. Conception ok. Declines BC. Has normal discomfort with sex, no bleeding. Last Pap: 10/09/17 Results were no abnormalities/neg HPV DNA Hx of STDs: HPV in 2004  Has had external vag itching without increased d/c/odor for several months. No meds to treat. 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 Uses scented soap and dryer sheets. Wears cotton underwear, no thongs, no pantyliners.   Mammogram: 10/28/19 Results were normal, repeat in 12 months.  Has appt 11/22 There is a FH of breast cancer in her PGM, genetic testing not indicated. There is no FH of ovarian cancer. The patient does do self-breast exams.  Tobacco use: The patient denies current or previous tobacco use. Alcohol use: none No drug use.  Exercise: very active  She does get adequate calcium and Vitamin D in her diet. Labs with PCP.   Past Medical History:  Diagnosis Date   ASCUS with positive high risk HPV cervical 2004   neg colpo bx 2/04   GERD (gastroesophageal reflux disease)    Hypothyroid    Lichen sclerosus    Migraines    PCOS (polycystic ovarian syndrome)    Vaccine for human papilloma virus (HPV) types 6, 11,  16, and 18 administered     Past Surgical History:  Procedure Laterality Date   COLPOSCOPY  2004   WISDOM TOOTH EXTRACTION  2005    Family History  Problem Relation Age of Onset   Hyperlipidemia Mother    Hypertension Mother    Hyperlipidemia Father    Hypertension Father    Prostate cancer Father    Depression Brother    Depression Maternal Grandmother    Diabetes Maternal Grandmother    Stroke Maternal Grandmother    COPD Maternal Grandfather    Diabetes Maternal Grandfather    Stroke Maternal Grandfather    Depression Paternal Grandmother    Breast cancer Paternal Grandmother 59   Arthritis Paternal Grandfather    Cancer Paternal Grandfather    Heart disease Paternal Grandfather    Hyperlipidemia Paternal Grandfather    Hypertension Paternal Grandfather    Kidney disease Paternal Grandfather     Social History   Socioeconomic History   Marital status: Married    Spouse name: Not on file   Number of children: Not on file   Years of education: Not on file   Highest education level: Not on file  Occupational History   Not on file  Tobacco Use   Smoking status: Never   Smokeless tobacco: Never  Vaping Use   Vaping Use: Never used  Substance and Sexual  Activity   Alcohol use: Yes    Comment: ocassional   Drug use: Not Currently   Sexual activity: Yes    Birth control/protection: None, Condom  Other Topics Concern   Not on file  Social History Narrative   Married.   No children.   Works as a Technical brewer at [employer].   Enjoys exercising, shopping, bowling, traveling.   Social Determinants of Health   Financial Resource Strain: Not on file  Food Insecurity: Not on file  Transportation Needs: Not on file  Physical Activity: Not on file  Stress: Not on file  Social Connections: Not on file  Intimate Partner Violence: Not on file    Outpatient Medications Prior to Visit  Medication Sig Dispense Refill   Cholecalciferol (VITAMIN D3) 2000 units  capsule Take by mouth.     clindamycin (CLEOCIN T) 1 % external solution Apply topically as directed. Apply once to twice daily to affected areas on neck and face as needed for flares 30 mL 2   ketoconazole (NIZORAL) 2 % shampoo USE TO SHAMPOO SCALP 3-7 TIMES PER WEEK. LEAVE ON FOR 5-10 MINUTES BEFORE RINSING OUT. 120 mL 11   levothyroxine (SYNTHROID) 112 MCG tablet Take 1 tablet by mouth every morning on an empty stomach with water only.  No food or other medications for 30 minutes. 90 tablet 1   Multiple Vitamin (MULTI-VITAMINS) TABS Take by mouth.     triamcinolone (KENALOG) 0.1 % APPLY 1 APPLICATION TOPICALLY 2 (TWO) TIMES DAILY AS NEEDED. 80 g 3   diclofenac (VOLTAREN) 75 MG EC tablet TAKE 1 TABLET BY MOUTH 2 TIMES DAILY. (Patient not taking: Reported on 10/20/2020) 60 tablet 1   No facility-administered medications prior to visit.    ROS:  Review of Systems  Constitutional:  Negative for fatigue, fever and unexpected weight change.  Respiratory:  Negative for cough, shortness of breath and wheezing.   Cardiovascular:  Negative for chest pain, palpitations and leg swelling.  Gastrointestinal:  Negative for blood in stool, constipation, diarrhea, nausea and vomiting.  Endocrine: Negative for cold intolerance, heat intolerance and polyuria.  Genitourinary:  Positive for menstrual problem. Negative for dyspareunia, dysuria, flank pain, frequency, genital sores, hematuria, pelvic pain, urgency, vaginal bleeding, vaginal discharge and vaginal pain.  Musculoskeletal:  Negative for back pain, joint swelling and myalgias.  Skin:  Negative for rash.  Neurological:  Negative for dizziness, syncope, light-headedness, numbness and headaches.  Hematological:  Negative for adenopathy.  Psychiatric/Behavioral:  Negative for agitation, confusion, sleep disturbance and suicidal ideas. The patient is not nervous/anxious.  BREAST: No symptoms   Objective: BP 130/80   Ht 5\' 2"  (1.575 m)   Wt 242 lb  (109.8 kg)   LMP 01/05/2021 (Approximate)   BMI 44.26 kg/m    Physical Exam Constitutional:      Appearance: She is well-developed.  Genitourinary:     Vulva normal.     Genitourinary Comments: BILAT LABIA MINORA WITH HYPERTROPHY/CHRONIC IRRITATION; FEW FISSURES     Right Labia: rash.     Right Labia: No tenderness or lesions.    Left Labia: rash.     Left Labia: No tenderness or lesions.    No vaginal discharge, erythema or tenderness.      Right Adnexa: not tender and no mass present.    Left Adnexa: not tender and no mass present.    No cervical motion tenderness, friability or polyp.     Uterus is not enlarged or tender.  Breasts:  Right: No mass, nipple discharge, skin change or tenderness.     Left: No mass, nipple discharge, skin change or tenderness.  Neck:     Thyroid: No thyromegaly.  Cardiovascular:     Rate and Rhythm: Normal rate and regular rhythm.     Heart sounds: Normal heart sounds. No murmur heard. Pulmonary:     Effort: Pulmonary effort is normal.     Breath sounds: Normal breath sounds.  Abdominal:     Palpations: Abdomen is soft.     Tenderness: There is no abdominal tenderness. There is no guarding or rebound.  Musculoskeletal:        General: Normal range of motion.     Cervical back: Normal range of motion.  Lymphadenopathy:     Cervical: No cervical adenopathy.  Neurological:     General: No focal deficit present.     Mental Status: She is alert and oriented to person, place, and time.     Cranial Nerves: No cranial nerve deficit.  Skin:    General: Skin is warm and dry.  Psychiatric:        Mood and Affect: Mood normal.        Behavior: Behavior normal.        Thought Content: Thought content normal.        Judgment: Judgment normal.  Vitals reviewed.    Assessment/Plan: Encounter for annual routine gynecological examination  Cervical cancer screening - Plan: Cytology - PAP  Screening for HPV (human papillomavirus) - Plan:  Cytology - PAP  Encounter for screening mammogram for malignant neoplasm of breast; pt has mammo appt  Abnormal uterine bleeding (AUB) - Plan: US PELVIS TRANSVAGINAL NON-OB (TV ONLY); for a few cycles, hx of ovar cysts. Check GYN u/s. Will f/u with results  Chronic vaginitis - Plan: clotrimazole-betamethasone (LOTRISONE) cream; pos sx and hx. Most likely fungal. Rx lotrisone crm for 2 wks. F/u if sx persist. Change to dove sens skin soap, line dry underwear.  Need for immunization against influenza - Plan: Flu Vaccine QUAD 60mo+IM (Fluarix, Fluzone & Alfiuria Quad PF)            Meds ordered this encounter  Medications   clotrimazole-betamethasone (LOTRISONE) cream    Sig: Apply externally twice a day for 2 weeks    Dispense:  15 g    Refill:  0    Order Specific Question:   Supervising Provider    Answer:   Gae Dry [[id number]]    GYN counsel adequate intake of calcium and vitamin D, diet and exercise     F/U  Return in about 1 week (around 01/26/2021) for GYN u/s for AUB--[facility] to call pt.  Jedi Catalfamo B. Shariya Gaster, PA-C 01/19/2021 10:12 AM

## 2021-01-19 ENCOUNTER — Other Ambulatory Visit: Payer: Self-pay

## 2021-01-19 ENCOUNTER — Ambulatory Visit (INDEPENDENT_AMBULATORY_CARE_PROVIDER_SITE_OTHER): Payer: 59 | Admitting: Obstetrics and Gynecology

## 2021-01-19 ENCOUNTER — Other Ambulatory Visit (HOSPITAL_COMMUNITY)
Admission: RE | Admit: 2021-01-19 | Discharge: 2021-01-19 | Disposition: A | Discharge status: Home / Self Care | Payer: 59 | Source: Ambulatory Visit | Attending: Obstetrics and Gynecology | Admitting: Obstetrics and Gynecology

## 2021-01-19 ENCOUNTER — Encounter: Payer: Self-pay | Admitting: Obstetrics and Gynecology

## 2021-01-19 VITALS — BP 130/80 | Ht 62.0 in | Wt 242.0 lb

## 2021-01-19 DIAGNOSIS — Z1151 Encounter for screening for human papillomavirus (HPV): Secondary | ICD-10-CM | POA: Diagnosis not present

## 2021-01-19 DIAGNOSIS — Z124 Encounter for screening for malignant neoplasm of cervix: Secondary | ICD-10-CM

## 2021-01-19 DIAGNOSIS — N761 Subacute and chronic vaginitis: Secondary | ICD-10-CM

## 2021-01-19 DIAGNOSIS — Z1231 Encounter for screening mammogram for malignant neoplasm of breast: Secondary | ICD-10-CM | POA: Diagnosis not present

## 2021-01-19 DIAGNOSIS — Z23 Encounter for immunization: Secondary | ICD-10-CM

## 2021-01-19 DIAGNOSIS — Z01419 Encounter for gynecological examination (general) (routine) without abnormal findings: Secondary | ICD-10-CM

## 2021-01-19 DIAGNOSIS — N939 Abnormal uterine and vaginal bleeding, unspecified: Secondary | ICD-10-CM | POA: Diagnosis not present

## 2021-01-19 MED ORDER — CLOTRIMAZOLE-BETAMETHASONE 1-0.05 % EX CREA
TOPICAL_CREAM | CUTANEOUS | 0 refills | Status: DC
Start: 2021-01-19 — End: 2021-05-26
  Filled 2021-01-19: qty 15, 14d supply, fill #0

## 2021-01-21 LAB — CYTOLOGY - PAP
Comment: NEGATIVE
Diagnosis: NEGATIVE
Diagnosis: REACTIVE
High risk HPV: NEGATIVE

## 2021-01-31 ENCOUNTER — Ambulatory Visit (INDEPENDENT_AMBULATORY_CARE_PROVIDER_SITE_OTHER): Payer: 59

## 2021-01-31 ENCOUNTER — Other Ambulatory Visit: Payer: Self-pay

## 2021-01-31 ENCOUNTER — Other Ambulatory Visit: Payer: Self-pay | Admitting: Obstetrics and Gynecology

## 2021-01-31 DIAGNOSIS — N939 Abnormal uterine and vaginal bleeding, unspecified: Secondary | ICD-10-CM | POA: Diagnosis not present

## 2021-02-01 ENCOUNTER — Telehealth: Payer: Self-pay | Admitting: Obstetrics and Gynecology

## 2021-02-01 NOTE — Telephone Encounter (Signed)
Pt aware of GYN u/s results. Has subseptate uterus, no evid of PCOS or leio. Pt with occas BTB, but not recently. Sx could be from subseptate uterus. Conception ok. Since no recent sx, will follow cycles for now. Pt will f/u if sx persist/worsen.

## 2021-02-02 ENCOUNTER — Other Ambulatory Visit: Payer: Self-pay

## 2021-02-02 MED ORDER — ALTRENO 0.05 % EX LOTN: TOPICAL_LOTION | CUTANEOUS | 11 refills | Status: DC

## 2021-02-04 ENCOUNTER — Ambulatory Visit
Admission: RE | Admit: 2021-02-04 | Discharge: 2021-02-04 | Disposition: A | Discharge status: Home / Self Care | Payer: 59 | Source: Ambulatory Visit | Attending: Primary Care | Admitting: Primary Care

## 2021-02-04 ENCOUNTER — Other Ambulatory Visit: Payer: Self-pay

## 2021-02-04 DIAGNOSIS — Z1231 Encounter for screening mammogram for malignant neoplasm of breast: Secondary | ICD-10-CM | POA: Insufficient documentation

## 2021-03-15 ENCOUNTER — Encounter: Payer: Self-pay | Admitting: Dermatology

## 2021-03-22 ENCOUNTER — Other Ambulatory Visit: Payer: Self-pay

## 2021-03-22 ENCOUNTER — Telehealth: Payer: 59 | Admitting: Nurse Practitioner

## 2021-03-22 DIAGNOSIS — J111 Influenza due to unidentified influenza virus with other respiratory manifestations: Secondary | ICD-10-CM

## 2021-03-22 MED ORDER — OSELTAMIVIR PHOSPHATE 75 MG PO CAPS
75.0000 mg | ORAL_CAPSULE | Freq: Two times a day (BID) | ORAL | 0 refills | Status: AC
  Filled 2021-03-22: qty 10, 5d supply, fill #0

## 2021-03-22 NOTE — Progress Notes (Signed)
E visit for Flu like symptoms °  °We are sorry that you are not feeling well.  Here is how we plan to help! °Based on what you have shared with me it looks like you may have a respiratory virus that may be influenza. ° °Influenza or “the flu” is   an infection caused by a respiratory virus. The flu virus is highly contagious and persons who did not receive their yearly flu vaccination may “catch” the flu from close contact. ° °We have anti-viral medications to treat the viruses that cause this infection. They are not a “cure” and only shorten the course of the infection. These prescriptions are most effective when they are given within the first 2 days of “flu” symptoms. Antiviral medication are indicated if you have a high risk of complications from the flu. You should  also consider an antiviral medication if you are in close contact with someone who is at risk. These medications can help patients avoid complications from the flu  but have side effects that you should know. Possible side effects from Tamiflu or oseltamivir include nausea, vomiting, diarrhea, dizziness, headaches, eye redness, sleep problems or other respiratory symptoms. °You should not take Tamiflu if you have an allergy to oseltamivir or any to the ingredients in Tamiflu. ° °Based upon your symptoms and potential risk factors I have prescribed Oseltamivir (Tamiflu).  It has been sent to your designated pharmacy.  You will take one 75 mg capsule orally twice a day for the next 5 days. ° °ANYONE WHO HAS FLU SYMPTOMS SHOULD: °Stay home. The flu is highly contagious and going out or to work exposes others! °Be sure to drink plenty of fluids. Water is fine as well as fruit juices, sodas and electrolyte beverages. You may want to stay away from caffeine or alcohol. If you are nauseated, try taking small sips of liquids. How do you know if you are getting enough fluid? Your urine should be a pale yellow or almost colorless. °Get rest. °Taking a steamy  shower or using a humidifier may help nasal congestion and ease sore throat pain. Using a saline nasal spray works much the same way. °Cough drops, hard candies and sore throat lozenges may ease your cough. °Line up a caregiver. Have someone check on you regularly. ° ° °GET HELP RIGHT AWAY IF: °You cannot keep down liquids or your medications. °You become short of breath °Your fell like you are going to pass out or loose consciousness. °Your symptoms persist after you have completed your treatment plan °MAKE SURE YOU  °Understand these instructions. °Will watch your condition. °Will get help right away if you are not doing well or get worse. ° °Your e-visit answers were reviewed by a board certified advanced clinical practitioner to complete your personal care plan.  Depending on the condition, your plan could have included both over the counter or prescription medications. ° °If there is a problem please reply  once you have received a response from your provider. ° °Your safety is important to us.  If you have drug allergies check your prescription carefully.   ° °You can use MyChart to ask questions about today’s visit, request a non-urgent call back, or ask for a work or school excuse for 24 hours related to this e-Visit. If it has been greater than 24 hours you will need to follow up with your provider, or enter a new e-Visit to address those concerns. ° °You will get an e-mail in the next   two days asking about your experience.  I hope that your e-visit has been valuable and will speed your recovery. Thank you for using e-visits.   I spent approximately 10 minutes reviewing the patient's history, current symptoms and coordinating their care today.    Meds ordered this encounter  Medications   oseltamivir (TAMIFLU) 75 MG capsule    Sig: Take 1 capsule (75 mg total) by mouth 2 (two) times daily for 5 days.    Dispense:  10 capsule    Refill:  0

## 2021-03-23 ENCOUNTER — Encounter: Payer: 59 | Admitting: Dermatology

## 2021-03-23 ENCOUNTER — Ambulatory Visit: Payer: 59 | Admitting: Dermatology

## 2021-03-24 ENCOUNTER — Other Ambulatory Visit: Payer: Self-pay

## 2021-03-24 ENCOUNTER — Encounter: Payer: Self-pay | Admitting: Family

## 2021-03-24 ENCOUNTER — Telehealth (INDEPENDENT_AMBULATORY_CARE_PROVIDER_SITE_OTHER): Payer: 59 | Admitting: Family

## 2021-03-24 VITALS — BP 107/75 | HR 96 | Temp 98.0°F | Ht 62.0 in | Wt 234.0 lb

## 2021-03-24 DIAGNOSIS — R051 Acute cough: Secondary | ICD-10-CM | POA: Insufficient documentation

## 2021-03-24 DIAGNOSIS — J069 Acute upper respiratory infection, unspecified: Secondary | ICD-10-CM | POA: Insufficient documentation

## 2021-03-24 DIAGNOSIS — R509 Fever, unspecified: Secondary | ICD-10-CM | POA: Insufficient documentation

## 2021-03-24 DIAGNOSIS — Z20828 Contact with and (suspected) exposure to other viral communicable diseases: Secondary | ICD-10-CM | POA: Diagnosis not present

## 2021-03-24 NOTE — Assessment & Plan Note (Signed)
Swab ordered for RSV, flu and covid. pending results.  Okay to alternate with Tylenol and/or ibuprofen as needed for fever

## 2021-03-24 NOTE — Progress Notes (Signed)
MyChart Video Visit    Virtual Visit via Video Note   This visit type was conducted due to national recommendations for restrictions regarding the COVID-19 Pandemic (e.g. social distancing) in an effort to limit this patient's exposure and mitigate transmission in our community. This patient is at least at moderate risk for complications without adequate follow up. This format is felt to be most appropriate for this patient at this time. Physical exam was limited by quality of the video and audio technology used for the visit. CMA was able to get the patient set up on a video visit.  Patient location: Home. Patient and provider in visit Provider location: Office  I discussed the limitations of evaluation and management by telemedicine and the availability of in person appointments. The patient expressed understanding and agreed to proceed.  Visit Date: 03/24/2021  Today's healthcare provider: Eugenia Pancoast, FNP     Subjective:    Patient ID: Erika Henry, female    DOB: 05-29-1980, 40 y.o.   MRN: [id number]  Chief Complaint  Patient presents with   Cough   Fever   Sore Throat   Nasal Congestion    Cough Associated symptoms include a fever and myalgias. Pertinent negatives include no chest pain, chills, ear pain, sore throat, shortness of breath or wheezing.  Fever  Associated symptoms include congestion and coughing. Pertinent negatives include no chest pain, ear pain, sore throat or wheezing.  Sore Throat  Associated symptoms include congestion and coughing. Pertinent negatives include no ear pain or shortness of breath.   40 y/o female here with c/o scratchy sore throat, fever (up to 104 F) body aches, decreased appetite, and chills. Cough with wet productive sputum.  Also with nasal congestion. Did do a virtual urgent care visit 12/27 and was given tamiflu (75 mg po bid x 5 days) which she has started. Taking otc nyquil which helps as well. Had two at home covid tests  that were negative, and she has not been tested for the flu.   Sx have been for the last three days.    Past Medical History:  Diagnosis Date   ASCUS with positive high risk HPV cervical 2004   neg colpo bx 2/04   GERD (gastroesophageal reflux disease)    Hypothyroid    Lichen sclerosus    Migraines    PCOS (polycystic ovarian syndrome)    Vaccine for human papilloma virus (HPV) types 6, 11, 16, and 18 administered     Past Surgical History:  Procedure Laterality Date   COLPOSCOPY  2004   WISDOM TOOTH EXTRACTION  2005    Family History  Problem Relation Age of Onset   Hyperlipidemia Mother    Hypertension Mother    Hyperlipidemia Father    Hypertension Father    Prostate cancer Father    Depression Brother    Depression Maternal Grandmother    Diabetes Maternal Grandmother    Stroke Maternal Grandmother    COPD Maternal Grandfather    Diabetes Maternal Grandfather    Stroke Maternal Grandfather    Depression Paternal Grandmother    Breast cancer Paternal Grandmother 87   Arthritis Paternal Grandfather    Cancer Paternal Grandfather    Heart disease Paternal Grandfather    Hyperlipidemia Paternal Grandfather    Hypertension Paternal Grandfather    Kidney disease Paternal Grandfather     Social History   Socioeconomic History   Marital status: Married    Spouse name: Not on file  Number of children: Not on file   Years of education: Not on file   Highest education level: Not on file  Occupational History   Not on file  Tobacco Use   Smoking status: Never   Smokeless tobacco: Never  Vaping Use   Vaping Use: Never used  Substance and Sexual Activity   Alcohol use: Yes    Comment: ocassional   Drug use: Not Currently   Sexual activity: Yes    Birth control/protection: None, Condom  Other Topics Concern   Not on file  Social History Narrative   Married.   No children.   Works as a Technical brewer at [employer].   Enjoys exercising, shopping, bowling,  traveling.   Social Determinants of Health   Financial Resource Strain: Not on file  Food Insecurity: Not on file  Transportation Needs: Not on file  Physical Activity: Not on file  Stress: Not on file  Social Connections: Not on file  Intimate Partner Violence: Not on file    Outpatient Medications Prior to Visit  Medication Sig Dispense Refill   Cholecalciferol (VITAMIN D3) 2000 units capsule Take by mouth.     clindamycin (CLEOCIN T) 1 % external solution Apply topically as directed. Apply once to twice daily to affected areas on neck and face as needed for flares 30 mL 2   clotrimazole-betamethasone (LOTRISONE) cream Apply externally twice a day for 2 weeks 15 g 0   ketoconazole (NIZORAL) 2 % shampoo USE TO SHAMPOO SCALP 3-7 TIMES PER WEEK. LEAVE ON FOR 5-10 MINUTES BEFORE RINSING OUT. 120 mL 11   levothyroxine (SYNTHROID) 112 MCG tablet Take 1 tablet by mouth every morning on an empty stomach with water only.  No food or other medications for 30 minutes. 90 tablet 1   Multiple Vitamin (MULTI-VITAMINS) TABS Take by mouth.     oseltamivir (TAMIFLU) 75 MG capsule Take 1 capsule (75 mg total) by mouth 2 (two) times daily for 5 days. 10 capsule 0   Tretinoin (ALTRENO) 0.05 % LOTN Apply nightly as tolerated. 45 g 11   triamcinolone (KENALOG) 0.1 % APPLY 1 APPLICATION TOPICALLY 2 (TWO) TIMES DAILY AS NEEDED. 80 g 3   No facility-administered medications prior to visit.    No Known Allergies  Review of Systems  Constitutional:  Positive for fever. Negative for chills.  HENT:  Positive for congestion. Negative for ear pain and sore throat.   Respiratory:  Positive for cough and sputum production. Negative for shortness of breath and wheezing.   Cardiovascular:  Negative for chest pain.  Musculoskeletal:  Positive for myalgias.  All other systems reviewed and are negative.     Objective:    Physical Exam Vitals reviewed.  Constitutional:      General: She is not in acute  distress.    Appearance: She is well-developed. She is not ill-appearing, toxic-appearing or diaphoretic.  HENT:     Head: Normocephalic.  Pulmonary:     Effort: Pulmonary effort is normal.  Neurological:     General: No focal deficit present.     Mental Status: She is alert and oriented to person, place, and time.  Psychiatric:        Mood and Affect: Mood normal.        Behavior: Behavior normal.    BP 107/75    Pulse 96    Temp 98 F (36.7 C) (Oral)    Ht 5\' 2"  (1.575 m)    Wt 234 lb (106.1 kg)  LMP 03/09/2021 (Approximate)    BMI 42.80 kg/m  Wt Readings from Last 3 Encounters:  03/24/21 234 lb (106.1 kg)  01/19/21 242 lb (109.8 kg)  10/20/20 246 lb (111.6 kg)       Assessment & Plan:   Problem List Items Addressed This Visit       Respiratory   Upper respiratory infection, acute    Swab ordered for further testing for RSV flu and COVID.  Pending results patient already taking Tamiflu patient directed to continue with Tamiflu as directed until testing is resulted.  May change plan of care dependent on those results.  Okay to continue with DayQuil and NyQuil advised patient to monitor the amount of Tylenol that she is taking not to exceed over 3 g daily.  Advised patient on supportive measures:  Be sure to rest, drink plenty of fluids, Follow up if fever >101, if symptoms worsen or if symptoms are not improved in 3 days. Patient verbalizes understanding.           Other   Acute cough   Relevant Orders   COVID-19, Flu A+B and RSV   Fever - Primary    Swab ordered for RSV, flu and covid. pending results.  Okay to alternate with Tylenol and/or ibuprofen as needed for fever      Relevant Orders   COVID-19, Flu A+B and RSV   Exposure to the flu    Continue Tamiflu as prescribed, I have ordered flu testing and pending results.       Relevant Orders   COVID-19, Flu A+B and RSV    I am having Yajahira A. Kinnaird maintain her Vitamin D3, Multi-Vitamins, ketoconazole,  triamcinolone cream, clindamycin, levothyroxine, clotrimazole-betamethasone, [place], and oseltamivir.  No orders of the defined types were placed in this encounter.   I discussed the assessment and treatment plan with the patient. The patient was provided an opportunity to ask questions and all were answered. The patient agreed with the plan and demonstrated an understanding of the instructions.   The patient was advised to call back or seek an in-person evaluation if the symptoms worsen or if the condition fails to improve as anticipated.  I provided 17 minutes of face-to-face time during this encounter.   Eugenia Pancoast, [place] [facility] [phone] (phone) [phone] (fax)  [place]

## 2021-03-24 NOTE — Assessment & Plan Note (Signed)
Swab ordered for further testing for RSV flu and COVID.  Pending results patient already taking Tamiflu patient directed to continue with Tamiflu as directed until testing is resulted.  May change plan of care dependent on those results.  Okay to continue with DayQuil and NyQuil advised patient to monitor the amount of Tylenol that she is taking not to exceed over 3 g daily.  Advised patient on supportive measures:  Be sure to rest, drink plenty of fluids, Follow up if fever >101, if symptoms worsen or if symptoms are not improved in 3 days. Patient verbalizes understanding.

## 2021-03-24 NOTE — Assessment & Plan Note (Signed)
Continue Tamiflu as prescribed, I have ordered flu testing and pending results.

## 2021-03-25 ENCOUNTER — Encounter: Payer: Self-pay | Admitting: Family

## 2021-03-25 LAB — COVID-19, FLU A+B AND RSV
Influenza A, NAA: NOT DETECTED
Influenza B, NAA: NOT DETECTED
RSV, NAA: NOT DETECTED
SARS-CoV-2, NAA: DETECTED — AB

## 2021-04-13 NOTE — Telephone Encounter (Signed)
Tried calling the patient and she did not answer. LVM for patient to call back.

## 2021-04-15 ENCOUNTER — Ambulatory Visit: Payer: 59 | Admitting: Primary Care

## 2021-04-18 ENCOUNTER — Other Ambulatory Visit: Payer: Self-pay

## 2021-04-18 MED ORDER — ALTRENO 0.05 % EX LOTN: TOPICAL_LOTION | CUTANEOUS | 11 refills | Status: DC

## 2021-04-19 NOTE — Telephone Encounter (Signed)
Tried calling the patient to see how she was doing LVM for her to call back.

## 2021-04-22 ENCOUNTER — Other Ambulatory Visit: Payer: Self-pay

## 2021-04-22 ENCOUNTER — Ambulatory Visit (INDEPENDENT_AMBULATORY_CARE_PROVIDER_SITE_OTHER): Payer: 59

## 2021-04-22 ENCOUNTER — Ambulatory Visit: Payer: 59 | Admitting: Family

## 2021-04-22 ENCOUNTER — Encounter: Payer: Self-pay | Admitting: Family

## 2021-04-22 VITALS — BP 126/84 | HR 71 | Temp 97.1°F | Ht 62.0 in | Wt 227.0 lb

## 2021-04-22 DIAGNOSIS — E039 Hypothyroidism, unspecified: Secondary | ICD-10-CM | POA: Diagnosis not present

## 2021-04-22 DIAGNOSIS — J45901 Unspecified asthma with (acute) exacerbation: Secondary | ICD-10-CM | POA: Diagnosis not present

## 2021-04-22 DIAGNOSIS — R062 Wheezing: Secondary | ICD-10-CM

## 2021-04-22 DIAGNOSIS — E66811 Obesity, class 1: Secondary | ICD-10-CM | POA: Insufficient documentation

## 2021-04-22 DIAGNOSIS — E282 Polycystic ovarian syndrome: Secondary | ICD-10-CM | POA: Diagnosis not present

## 2021-04-22 DIAGNOSIS — J209 Acute bronchitis, unspecified: Secondary | ICD-10-CM | POA: Diagnosis not present

## 2021-04-22 DIAGNOSIS — E66812 Obesity, class 2: Secondary | ICD-10-CM | POA: Insufficient documentation

## 2021-04-22 LAB — BASIC METABOLIC PANEL
BUN: 14 mg/dL (ref 6–23)
CO2: 25 mEq/L (ref 19–32)
Calcium: 9.1 mg/dL (ref 8.4–10.5)
Chloride: 105 mEq/L (ref 96–112)
Creatinine, Ser: 0.77 mg/dL (ref 0.40–1.20)
GFR: 95.69 mL/min (ref >60.00)
Glucose, Bld: 88 mg/dL (ref 70–99)
Potassium: 4.6 mEq/L (ref 3.5–5.1)
Sodium: 138 mEq/L (ref 135–145)

## 2021-04-22 LAB — CBC WITH DIFFERENTIAL/PLATELET
Basophils Absolute: 0.1 10*3/uL (ref 0.0–0.1)
Basophils Relative: 1.1 % (ref 0.0–3.0)
Eosinophils Absolute: 0.7 10*3/uL (ref 0.0–0.7)
Eosinophils Relative: 9.6 % — ABNORMAL HIGH (ref 0.0–5.0)
HCT: 41.9 % (ref 36.0–46.0)
Hemoglobin: 14.1 g/dL (ref 12.0–15.0)
Lymphocytes Relative: 24.4 % (ref 12.0–46.0)
Lymphs Abs: 1.8 10*3/uL (ref 0.7–4.0)
MCHC: 33.8 g/dL (ref 30.0–36.0)
MCV: 87 fl (ref 78.0–100.0)
Monocytes Absolute: 0.6 10*3/uL (ref 0.1–1.0)
Monocytes Relative: 7.7 % (ref 3.0–12.0)
Neutro Abs: 4.1 10*3/uL (ref 1.4–7.7)
Neutrophils Relative %: 57.2 % (ref 43.0–77.0)
Platelets: 371 10*3/uL (ref 150.0–400.0)
RBC: 4.81 Mil/uL (ref 3.87–5.11)
RDW: 13.3 % (ref 11.5–15.5)
WBC: 7.2 10*3/uL (ref 4.0–10.5)

## 2021-04-22 LAB — TSH: TSH: 2.25 u[IU]/mL (ref 0.35–5.50)

## 2021-04-22 MED ORDER — PREDNISONE 20 MG PO TABS
ORAL_TABLET | ORAL | 0 refills | Status: DC
  Filled 2021-04-22: qty 10, 5d supply, fill #0

## 2021-04-22 MED ORDER — FLUTICASONE PROPIONATE HFA 110 MCG/ACT IN AERO
1.0000 | INHALATION_SPRAY | Freq: Two times a day (BID) | RESPIRATORY_TRACT | 0 refills | Status: DC
  Filled 2021-04-22: qty 12, 60d supply, fill #0

## 2021-04-22 MED ORDER — CHLORHEXIDINE GLUCONATE 0.12 % MT SOLN
OROMUCOSAL | 0 refills | Status: DC
  Filled 2021-04-22: qty 473, 15d supply, fill #0

## 2021-04-22 MED ORDER — AMOXICILLIN-POT CLAVULANATE 875-125 MG PO TABS
1.0000 | ORAL_TABLET | Freq: Two times a day (BID) | ORAL | 0 refills | Status: AC
  Filled 2021-04-22: qty 20, 10d supply, fill #0

## 2021-04-22 NOTE — Assessment & Plan Note (Signed)
Labs ordered pending results. May consider ozempic if able to approve with insurance.

## 2021-04-22 NOTE — Assessment & Plan Note (Signed)
Ordered TSH, pending results.

## 2021-04-22 NOTE — Assessment & Plan Note (Signed)
Ordered a1c, pending results. Hard for pt to lose weight. tsh also ordered. Pending results.

## 2021-04-22 NOTE — Progress Notes (Signed)
Established Patient Office Visit  Subjective:  Patient ID: Erika Henry, female    DOB: 06/22/1980  Age: 41 y.o. MRN: [id number]  CC:  Chief Complaint  Patient presents with   Cough    HPI Erika Henry is here today with concerns.   03/24/21, was sick saw [place] for virtual visit. Tested positive for covid, only took otc medications. She states started to improve as far as no longer with sore throat, and sinus pressure but now with lingering chest congestion, dry cough with occasional sputum (clear) and some sob at night with coughing. No fever/no chills. No ear pain.   Pt does have a h/o asthma  She also states that she has trouble losing weight, and is interested in weight loss. She has a h/o of PCOS which she states makes weight loss difficult. She has a friend that was on ozempic with good results.   Filed Weights   04/22/21 0839  Weight: 227 lb (103 kg)     Past Medical History:  Diagnosis Date   ASCUS with positive high risk HPV cervical 2004   neg colpo bx 2/04   GERD (gastroesophageal reflux disease)    Hypothyroid    Lichen sclerosus    Migraines    PCOS (polycystic ovarian syndrome)    Vaccine for human papilloma virus (HPV) types 6, 11, 16, and 18 administered     Past Surgical History:  Procedure Laterality Date   COLPOSCOPY  2004   WISDOM TOOTH EXTRACTION  2005    Family History  Problem Relation Age of Onset   Hyperlipidemia Mother    Hypertension Mother    Hyperlipidemia Father    Hypertension Father    Prostate cancer Father    Depression Brother    Depression Maternal Grandmother    Diabetes Maternal Grandmother    Stroke Maternal Grandmother    COPD Maternal Grandfather    Diabetes Maternal Grandfather    Stroke Maternal Grandfather    Depression Paternal Grandmother    Breast cancer Paternal Grandmother 104   Arthritis Paternal Grandfather    Cancer Paternal Grandfather    Heart disease Paternal Grandfather    Hyperlipidemia  Paternal Grandfather    Hypertension Paternal Grandfather    Kidney disease Paternal Grandfather     Social History   Socioeconomic History   Marital status: Married    Spouse name: Not on file   Number of children: Not on file   Years of education: Not on file   Highest education level: Not on file  Occupational History   Not on file  Tobacco Use   Smoking status: Never   Smokeless tobacco: Never  Vaping Use   Vaping Use: Never used  Substance and Sexual Activity   Alcohol use: Yes    Comment: ocassional   Drug use: Not Currently   Sexual activity: Yes    Birth control/protection: None, Condom  Other Topics Concern   Not on file  Social History Narrative   Married.   No children.   Works as a Technical brewer at [employer].   Enjoys exercising, shopping, bowling, traveling.   Social Determinants of Health   Financial Resource Strain: Not on file  Food Insecurity: Not on file  Transportation Needs: Not on file  Physical Activity: Not on file  Stress: Not on file  Social Connections: Not on file  Intimate Partner Violence: Not on file    Outpatient Medications Prior to Visit  Medication Sig Dispense Refill  Cholecalciferol (VITAMIN D3) 2000 units capsule Take by mouth.     clindamycin (CLEOCIN T) 1 % external solution Apply topically as directed. Apply once to twice daily to affected areas on neck and face as needed for flares 30 mL 2   clotrimazole-betamethasone (LOTRISONE) cream Apply externally twice a day for 2 weeks 15 g 0   ketoconazole (NIZORAL) 2 % shampoo USE TO SHAMPOO SCALP 3-7 TIMES PER WEEK. LEAVE ON FOR 5-10 MINUTES BEFORE RINSING OUT. 120 mL 11   levothyroxine (SYNTHROID) 112 MCG tablet Take 1 tablet by mouth every morning on an empty stomach with water only.  No food or other medications for 30 minutes. 90 tablet 1   Multiple Vitamin (MULTI-VITAMINS) TABS Take by mouth.     Tretinoin (ALTRENO) 0.05 % LOTN Apply nightly as tolerated. 45 g 11    triamcinolone (KENALOG) 0.1 % APPLY 1 APPLICATION TOPICALLY 2 (TWO) TIMES DAILY AS NEEDED. 80 g 3   No facility-administered medications prior to visit.    No Known Allergies  ROS Review of Systems  Constitutional:  Positive for fatigue and unexpected weight change (weight gain, hard to lose weight). Negative for chills and fever.  HENT:  Positive for congestion and postnasal drip. Negative for ear pain, sinus pressure and sore throat.   Respiratory:  Positive for cough, shortness of breath and wheezing.   Cardiovascular:  Negative for chest pain and palpitations.     Objective:    Physical Exam Constitutional:      General: She is not in acute distress.    Appearance: Normal appearance. She is obese. She is not ill-appearing, toxic-appearing or diaphoretic.  HENT:     Right Ear: Hearing normal. No drainage or tenderness. A middle ear effusion is present. Tympanic membrane is erythematous and bulging.     Left Ear: Hearing, tympanic membrane and ear canal normal.     Nose: Nose normal.     Mouth/Throat:     Mouth: Mucous membranes are dry.     Pharynx: Oropharynx is clear.  Eyes:     Pupils: Pupils are equal, round, and reactive to light.  Cardiovascular:     Rate and Rhythm: Normal rate and regular rhythm.  Pulmonary:     Effort: Pulmonary effort is normal.     Breath sounds: Examination of the right-upper field reveals wheezing. Examination of the left-upper field reveals wheezing. Examination of the left-lower field reveals wheezing. Wheezing present. No rhonchi or rales.    BP 126/84    Pulse 71    Temp (!) 97.1 F (36.2 C) (Temporal)    Ht 5\' 2"  (1.575 m)    Wt 227 lb (103 kg)    LMP 04/22/2021 (Exact Date)    SpO2 97%    BMI 41.52 kg/m  Wt Readings from Last 3 Encounters:  04/22/21 227 lb (103 kg)  03/24/21 234 lb (106.1 kg)  01/19/21 242 lb (109.8 kg)     There are no preventive care reminders to display for this patient.  There are no preventive care reminders  to display for this patient.  Lab Results  Component Value Date   TSH 3.69 10/20/2020   Lab Results  Component Value Date   WBC 6.7 10/14/2019   HGB 14.5 10/14/2019   HCT 42.0 10/14/2019   MCV 86.5 10/14/2019   PLT 363.0 10/14/2019   Lab Results  Component Value Date   NA 137 10/20/2020   K 4.5 10/20/2020   CO2 25 10/20/2020  GLUCOSE 88 10/20/2020   BUN 15 10/20/2020   CREATININE 0.76 10/20/2020   BILITOT 0.5 10/20/2020   ALKPHOS 85 10/20/2020   AST 47 (H) 10/20/2020   ALT 70 (H) 10/20/2020   PROT 7.0 10/20/2020   ALBUMIN 4.1 10/20/2020   CALCIUM 9.3 10/20/2020   ANIONGAP 7 03/30/2016   GFR 97.55 10/20/2020   Lab Results  Component Value Date   HGBA1C 5.4 10/20/2020      Assessment & Plan:   Problem List Items Addressed This Visit       Respiratory   Acute bronchitis with asthma with acute exacerbation    rx augmetnin, CXR today to r/o pnemonia. If pneumonia will add doxycycline. Also rx for prednisone sent for wheezing. Pt request flovent, sent in rx as coughing episodes at night time. Pt to f/u two weeks. Take antibiotic as prescribed. Increase oral fluids. Pt to f/u if sx worsen and or fail to improve in 2-3 days.       Relevant Medications   amoxicillin-clavulanate (AUGMENTIN) 875-125 MG tablet   fluticasone (FLOVENT HFA) 110 MCG/ACT inhaler   predniSONE (DELTASONE) 20 MG tablet   Other Relevant Orders   Basic metabolic panel     Endocrine   Hypothyroidism (acquired)    Ordered TSH, pending results.       PCOS (polycystic ovarian syndrome)    Ordered a1c, pending results. Hard for pt to lose weight. tsh also ordered. Pending results.       Relevant Orders   Hemoglobin Q0G   Basic metabolic panel     Other   Morbid obesity ([place])    Labs ordered pending results. May consider ozempic if able to approve with insurance.      Relevant Orders   TSH   Wheezing - Primary    rx prednisone and flonase. Pending cxr      Relevant Medications    amoxicillin-clavulanate (AUGMENTIN) 875-125 MG tablet   fluticasone (FLOVENT HFA) 110 MCG/ACT inhaler   predniSONE (DELTASONE) 20 MG tablet   Other Relevant Orders   DG Chest 2 View   CBC w/Diff   Basic metabolic panel    Meds ordered this encounter  Medications   amoxicillin-clavulanate (AUGMENTIN) 875-125 MG tablet    Sig: Take 1 tablet by mouth 2 (two) times daily for 10 days.    Dispense:  20 tablet    Refill:  0    Order Specific Question:   Supervising Provider    Answer:   BEDSOLE, AMY E [[id number]]   fluticasone (FLOVENT HFA) 110 MCG/ACT inhaler    Sig: Inhale 1 puff into the lungs in the morning and at bedtime.    Dispense:  12 g    Refill:  0    Order Specific Question:   Supervising Provider    Answer:   BEDSOLE, AMY E [[id number]]   predniSONE (DELTASONE) 20 MG tablet    Sig: Take two tablets po qd for five days    Dispense:  10 tablet    Refill:  0    Order Specific Question:   Supervising Provider    Answer:   Diona Browner, AMY E [[id number]]    Follow-up: Return in about 2 weeks (around 05/06/2021) for follow up / reassess symptoms.    Eugenia Pancoast, FNP

## 2021-04-22 NOTE — Patient Instructions (Signed)
Antibiotic sent to preferred pharmacy.   Flovent inhaler sent to pharmacy , please start, also start prednisone.  Complete xray(s) prior to leaving today. I will notify you of your results once received.  Please increase oral fluids, steamy hot shower/humidifier prn.  Please follow up if no improvement in 2-3 days.   It was a pleasure seeing you today! Please do not hesitate to reach out with any questions and or concerns.  Regards,   Eugenia Pancoast

## 2021-04-22 NOTE — Assessment & Plan Note (Signed)
rx prednisone and flonase. Pending cxr

## 2021-04-22 NOTE — Assessment & Plan Note (Signed)
rx augmetnin, CXR today to r/o pnemonia. If pneumonia will add doxycycline. Also rx for prednisone sent for wheezing. Pt request flovent, sent in rx as coughing episodes at night time. Pt to f/u two weeks. Take antibiotic as prescribed. Increase oral fluids. Pt to f/u if sx worsen and or fail to improve in 2-3 days.

## 2021-04-24 ENCOUNTER — Encounter: Payer: Self-pay | Admitting: Family

## 2021-04-25 ENCOUNTER — Encounter: Payer: Self-pay | Admitting: Family

## 2021-04-25 LAB — HEMOGLOBIN A1C: Hgb A1c MFr Bld: 5.2 % (ref 4.6–6.5)

## 2021-04-27 ENCOUNTER — Other Ambulatory Visit: Payer: Self-pay

## 2021-04-27 ENCOUNTER — Encounter: Payer: Self-pay | Admitting: Dermatology

## 2021-04-27 ENCOUNTER — Ambulatory Visit: Payer: 59 | Admitting: Dermatology

## 2021-04-27 DIAGNOSIS — L304 Erythema intertrigo: Secondary | ICD-10-CM | POA: Diagnosis not present

## 2021-04-27 DIAGNOSIS — L659 Nonscarring hair loss, unspecified: Secondary | ICD-10-CM

## 2021-04-27 DIAGNOSIS — Z1283 Encounter for screening for malignant neoplasm of skin: Secondary | ICD-10-CM

## 2021-04-27 DIAGNOSIS — L814 Other melanin hyperpigmentation: Secondary | ICD-10-CM | POA: Diagnosis not present

## 2021-04-27 DIAGNOSIS — E559 Vitamin D deficiency, unspecified: Secondary | ICD-10-CM

## 2021-04-27 DIAGNOSIS — D1800 Hemangioma unspecified site: Secondary | ICD-10-CM | POA: Diagnosis not present

## 2021-04-27 DIAGNOSIS — D229 Melanocytic nevi, unspecified: Secondary | ICD-10-CM | POA: Diagnosis not present

## 2021-04-27 DIAGNOSIS — L821 Other seborrheic keratosis: Secondary | ICD-10-CM | POA: Diagnosis not present

## 2021-04-27 DIAGNOSIS — L813 Cafe au lait spots: Secondary | ICD-10-CM

## 2021-04-27 DIAGNOSIS — Z8616 Personal history of COVID-19: Secondary | ICD-10-CM

## 2021-04-27 DIAGNOSIS — L578 Other skin changes due to chronic exposure to nonionizing radiation: Secondary | ICD-10-CM | POA: Diagnosis not present

## 2021-04-27 MED ORDER — KETOCONAZOLE 2 % EX CREA
1.0000 "application " | TOPICAL_CREAM | Freq: Two times a day (BID) | CUTANEOUS | 3 refills | Status: AC
  Filled 2021-04-27: qty 30, 10d supply, fill #0

## 2021-04-27 MED ORDER — HYDROCORTISONE 2.5 % EX CREA
TOPICAL_CREAM | Freq: Two times a day (BID) | CUTANEOUS | 1 refills | Status: DC | PRN
  Filled 2021-04-27: qty 28, 14d supply, fill #0

## 2021-04-27 NOTE — Progress Notes (Signed)
Follow-Up Visit   Subjective  Erika Henry is a 41 y.o. female who presents for the following: Annual Exam (Patient here for full body skin exam and skin cancer screening. No new or changing spots. //Patient also with rash at inguinal area. She has been using the zeasorb AF but it keeps coming back. //She has hair loss. She had COVID the 27th of December. She does have a history of mild vitamin D deficiency. She has hypothyroidism but last TSH was wnl. Previous ferritin level was wnl. //Patient has been taking Vitamin D 4000 IU per day. ).  The following portions of the chart were reviewed this encounter and updated as appropriate:   Tobacco   Allergies   Meds   Problems   Med Hx   Surg Hx   Fam Hx       Review of Systems:  No other skin or systemic complaints except as noted in HPI or Assessment and Plan.  Objective  Well appearing patient in no apparent distress; mood and affect are within normal limits.  A full examination was performed including scalp, head, eyes, ears, nose, lips, neck, chest, axillae, abdomen, back, buttocks, bilateral upper extremities, bilateral lower extremities, hands, feet, fingers, toes, fingernails, and toenails. All findings within normal limits unless otherwise noted below.  Scalp Diffuse thinning of the crown and widening of the midline part with retention of the frontal hairline - Reviewed progressive nature and prognosis.  Negative hair pull test  Right Medial Thigh Erythematous macerated patches at inguinal folds    Assessment & Plan  Alopecia Scalp  Chronic condition with duration or expected duration over one year. Condition is bothersome to patient. Not currently at goal.  Androgenic alopecia is a chronic condition related to genetics and/or hormonal changes associated with menopause in women causing hair thinning primarily on the crown with widening of the part and temporal hairline recession.  Can use OTC Rogaine (minoxidil) 5%  solution/foam as directed. Instructions given.  Recommend minoxidil 5% (Rogaine for men) solution or foam to be applied to the scalp and left in. This should ideally be used twice daily for best results but it helps with hair regrowth when used at least three times per week. Rogaine initially can cause increased hair shedding for the first few weeks but this will stop with continued use. In studies, people who used minoxidil (Rogaine) for at least 6 months had thicker hair than people who did not. Minoxidil topical (Rogaine) only works as long as it continues to be used. If if it is no longer used then the hair it has been helping to regrow can fall out. Minoxidil topical (Rogaine) can cause increased facial hair growth which can usually be managed easily with a battery-operated hair trimmer. If facial hair growth is bothersome, switching to the 2% women's version can decrease the risk of unwanted facial hair growth.   Erythema intertrigo Right Medial Thigh  Chronic condition with duration or expected duration over one year. Condition is bothersome to patient. Currently flared.  Start ketoconazole 2% cream twice a day as needed Start hydrocortisone 2.5% cream twice a day as needed up to 2 weeks  Continue zeasorb af daily for prevention  Topical steroids (such as triamcinolone, fluocinolone, fluocinonide, mometasone, clobetasol, halobetasol, betamethasone, hydrocortisone) can cause thinning and lightening of the skin if they are used for too long in the same area. Your physician has selected the right strength medicine for your problem and area affected on the body. Please  use your medication only as directed by your physician to prevent side effects.   Intertrigo is a chronic recurrent rash that occurs in skin fold areas that may be associated with friction; heat; moisture; yeast; fungus; and bacteria.  It is exacerbated by increased movement / activity; sweating; and higher atmospheric  temperature.   ketoconazole (NIZORAL) 2 % cream - Right Medial Thigh Apply 1 application topically 2 (two) times daily. As needed for rash  hydrocortisone 2.5 % cream - Right Medial Thigh Apply topically 2 (two) times daily as needed (Rash). For up to 2 weeks  Vitamin D deficiency Neck - Anterior  History Will recheck and adjust supplement as appropriate  Vitamin D, 25-hydroxy - Neck - Anterior   Lentigines - Scattered tan macules - Due to sun exposure - Benign-appearing, observe - Recommend daily broad spectrum sunscreen SPF 30+ to sun-exposed areas, reapply every 2 hours as needed. - Call for any changes  Seborrheic Keratoses - Stuck-on, waxy, tan-brown papules and/or plaques  - Benign-appearing - Discussed benign etiology and prognosis. - Observe - Call for any changes  Melanocytic Nevi - Tan-brown and/or pink-flesh-colored symmetric macules and papules - Benign appearing on exam today - Observation - Call clinic for new or changing moles - Recommend daily use of broad spectrum spf 30+ sunscreen to sun-exposed areas.   Hemangiomas - Red papules - Discussed benign nature - Observe - Call for any changes  Actinic Damage - Chronic condition, secondary to cumulative UV/sun exposure - diffuse scaly erythematous macules with underlying dyspigmentation - Recommend daily broad spectrum sunscreen SPF 30+ to sun-exposed areas, reapply every 2 hours as needed.  - Staying in the shade or wearing long sleeves, sun glasses (UVA+UVB protection) and wide brim hats (4-inch brim around the entire circumference of the hat) are also recommended for sun protection.  - Call for new or changing lesions.  Cafe au Lait  - Tan patch right axilla - Genetic - Benign, observe - Call for any changes  Skin cancer screening performed today.    Forest Gleason, MD

## 2021-04-27 NOTE — Patient Instructions (Signed)
Melanoma ABCDEs  Melanoma is the most dangerous type of skin cancer, and is the leading cause of death from skin disease.  You are more likely to develop melanoma if you: Have light-colored skin, light-colored eyes, or red or blond hair Spend a lot of time in the sun Tan regularly, either outdoors or in a tanning bed Have had blistering sunburns, especially during childhood Have a close family member who has had a melanoma Have atypical moles or large birthmarks  Early detection of melanoma is key since treatment is typically straightforward and cure rates are extremely high if we catch it early.   The first sign of melanoma is often a change in a mole or a new dark spot.  The ABCDE system is a way of remembering the signs of melanoma.  A for asymmetry:  The two halves do not match. B for border:  The edges of the growth are irregular. C for color:  A mixture of colors are present instead of an even brown color. D for diameter:  Melanomas are usually (but not always) greater than 30mm - the size of a pencil eraser. E for evolution:  The spot keeps changing in size, shape, and color.  Please check your skin once per month between visits. You can use a small mirror in front and a large mirror behind you to keep an eye on the back side or your body.   If you see any new or changing lesions before your next follow-up, please call to schedule a visit.  Please continue daily skin protection including broad spectrum sunscreen SPF 30+ to sun-exposed areas, reapplying every 2 hours as needed when you're outdoors.   Staying in the shade or wearing long sleeves, sun glasses (UVA+UVB protection) and wide brim hats (4-inch brim around the entire circumference of the hat) are also recommended for sun protection.    Recommend daily broad spectrum sunscreen SPF 30+ to sun-exposed areas, reapply every 2 hours as needed. Call for new or changing lesions.  Staying in the shade or wearing long sleeves, sun  glasses (UVA+UVB protection) and wide brim hats (4-inch brim around the entire circumference of the hat) are also recommended for sun protection.   Recommend taking Heliocare sun protection supplement daily in sunny weather for additional sun protection. For maximum protection on the sunniest days, you can take up to 2 capsules of regular Heliocare OR take 1 capsule of Heliocare Ultra. For prolonged exposure (such as a full day in the sun), you can repeat your dose of the supplement 4 hours after your first dose. Heliocare can be purchased at [employer], at some [facility] or at [email or web address].   Recommend minoxidil 5% (Rogaine for men) solution or foam to be applied to the scalp and left in. This should ideally be used twice daily for best results but it helps with hair regrowth when used at least three times per week. Rogaine initially can cause increased hair shedding for the first few weeks but this will stop with continued use. In studies, people who used minoxidil (Rogaine) for at least 6 months had thicker hair than people who did not. Minoxidil topical (Rogaine) only works as long as it continues to be used. If if it is no longer used then the hair it has been helping to regrow can fall out. Minoxidil topical (Rogaine) can cause increased facial hair growth which can usually be managed easily with a battery-operated hair trimmer. If facial hair growth is bothersome, switching to  the 2% women's version can decrease the risk of unwanted facial hair growth.     If You Need Anything After Your Visit  If you have any questions or concerns for your doctor, please call our main line at [phone] and press option 4 to reach your doctor's medical assistant. If no one answers, please leave a voicemail as directed and we will return your call as soon as possible. Messages left after 4 pm will be answered the following business day.   You may also send [place] a message via [place]. We typically  respond to MyChart messages within 1-2 business days.  For prescription refills, please ask your pharmacy to contact our office. Our fax number is [phone].  If you have an urgent issue when the clinic is closed that cannot wait until the next business day, you can page your doctor at the number below.    Please note that while we do our best to be available for urgent issues outside of office hours, we are not available 24/7.   If you have an urgent issue and are unable to reach [place], you may choose to seek medical care at your doctor's office, retail clinic, urgent care center, or emergency room.  If you have a medical emergency, please immediately call 911 or go to the emergency department.  Pager Numbers  - Dr. Nehemiah Massed: [phone]  - Dr. Laurence Ferrari: [phone]  - Dr. Nicole Kindred: [phone]  In the event of inclement weather, please call our main line at [phone] for an update on the status of any delays or closures.  Dermatology Medication Tips: Please keep the boxes that topical medications come in in order to help keep track of the instructions about where and how to use these. Pharmacies typically print the medication instructions only on the boxes and not directly on the medication tubes.   If your medication is too expensive, please contact our office at [phone] option 4 or send [place] a message through [place].   We are unable to tell what your co-pay for medications will be in advance as this is different depending on your insurance coverage. However, we may be able to find a substitute medication at lower cost or fill out paperwork to get insurance to cover a needed medication.   If a prior authorization is required to get your medication covered by your insurance company, please allow [place] 1-2 business days to complete this process.  Drug prices often vary depending on where the prescription is filled and some pharmacies may offer cheaper prices.  The website  www.goodrx.com contains coupons for medications through different pharmacies. The prices here do not account for what the cost may be with help from insurance (it may be cheaper with your insurance), but the website can give you the price if you did not use any insurance.  - You can print the associated coupon and take it with your prescription to the pharmacy.  - You may also stop by our office during regular business hours and pick up a GoodRx coupon card.  - If you need your prescription sent electronically to a different pharmacy, notify our office through [facility] or by phone at [phone] option 4.     Si Usted Necesita Algo Despus de Su Visita  Tambin puede enviarnos un mensaje a travs de Pharmacist, community. Por lo general respondemos a los mensajes de MyChart en el transcurso de 1 a 2 das hbiles.  Para renovar recetas, por favor pida a su farmacia  que se ponga en contacto con nuestra oficina. Harland Dingwall de fax es [place] [phone].  Si tiene un asunto urgente cuando la clnica est cerrada y que no puede esperar hasta el siguiente da hbil, puede llamar/localizar a su doctor(a) al nmero que aparece a continuacin.   Por favor, tenga en cuenta que aunque hacemos todo lo posible para estar disponibles para asuntos urgentes fuera del horario de [place], no estamos disponibles las 24 horas del da, los 7 das de la [place].   Si tiene un problema urgente y no puede comunicarse con nosotros, puede optar por buscar atencin mdica  en el consultorio de su doctor(a), en una clnica privada, en un centro de atencin urgente o en una sala de emergencias.  Si tiene Engineering geologist, por favor llame inmediatamente al 911 o vaya a la sala de emergencias.  Nmeros de bper  - Dr. Nehemiah Massed: [phone]  - Dra. Lesli Issa: [phone]  - Dra. Nicole Kindred: [phone]  En caso de inclemencias del [place], por favor llame a Johnsie Kindred principal al [phone] para una actualizacin  sobre el [place] de cualquier retraso o cierre.  Consejos para la medicacin en dermatologa: Por favor, guarde las cajas en las que vienen los medicamentos de uso tpico para ayudarle a seguir las instrucciones sobre dnde y cmo usarlos. Las farmacias generalmente imprimen las instrucciones del medicamento slo en las cajas y no directamente en los tubos del [place].   Si su medicamento es muy caro, por favor, pngase en contacto con Zigmund Daniel llamando al [phone] y presione la opcin 4 o envenos un mensaje a travs de Pharmacist, community.   No podemos decirle cul ser su copago por los medicamentos por adelantado ya que esto es diferente dependiendo de la cobertura de su seguro. Sin embargo, es posible que podamos encontrar un medicamento sustituto a Electrical engineer un formulario para que el seguro cubra el medicamento que se considera necesario.   Si se requiere una autorizacin previa para que su compaa de seguros [place] su medicamento, por favor permtanos de 1 a 2 das hbiles para completar este proceso.  Los precios de los medicamentos varan con frecuencia dependiendo del Environmental consultant de dnde se surte la receta y alguna farmacias pueden ofrecer precios ms baratos.  El sitio web www.goodrx.com tiene cupones para medicamentos de Airline pilot. Los precios aqu no tienen en cuenta lo que podra costar con la ayuda del seguro (puede ser ms barato con su seguro), pero el sitio web puede darle el precio si no utiliz Research scientist (physical sciences).  - Puede imprimir el cupn correspondiente y llevarlo con su receta a la farmacia.  - Tambin puede pasar por nuestra oficina durante el horario de atencin regular y Charity fundraiser una tarjeta de cupones de GoodRx.  - Si necesita que su receta se enve electrnicamente a una farmacia diferente, informe a nuestra oficina a travs de MyChart de [place] o por telfono llamando al [phone] y presione la opcin 4.

## 2021-04-29 ENCOUNTER — Other Ambulatory Visit: Payer: Self-pay

## 2021-04-29 ENCOUNTER — Other Ambulatory Visit: Payer: Self-pay | Admitting: Primary Care

## 2021-04-29 DIAGNOSIS — E039 Hypothyroidism, unspecified: Secondary | ICD-10-CM

## 2021-04-29 MED ORDER — LEVOTHYROXINE SODIUM 112 MCG PO TABS
ORAL_TABLET | ORAL | 1 refills | Status: DC
  Filled 2021-04-29: qty 90, 90d supply, fill #0
  Filled 2021-07-29: qty 19, 19d supply, fill #1
  Filled 2021-08-22: qty 71, 71d supply, fill #2

## 2021-04-29 MED FILL — Ketoconazole Shampoo 2%: CUTANEOUS | 30 days supply | Qty: 120 | Fill #0 | Status: AC

## 2021-04-29 MED FILL — Ketoconazole Shampoo 2%: CUTANEOUS | 14 days supply | Qty: 120 | Fill #0 | Status: CN

## 2021-05-02 ENCOUNTER — Other Ambulatory Visit: Payer: Self-pay

## 2021-05-06 ENCOUNTER — Other Ambulatory Visit: Payer: Self-pay

## 2021-05-06 ENCOUNTER — Ambulatory Visit: Payer: 59 | Admitting: Primary Care

## 2021-05-06 ENCOUNTER — Ambulatory Visit: Payer: 59 | Admitting: Family

## 2021-05-06 ENCOUNTER — Encounter: Payer: Self-pay | Admitting: Family

## 2021-05-06 VITALS — BP 112/78 | HR 72 | Temp 97.4°F | Ht 62.0 in | Wt 245.0 lb

## 2021-05-06 DIAGNOSIS — E559 Vitamin D deficiency, unspecified: Secondary | ICD-10-CM | POA: Diagnosis not present

## 2021-05-06 DIAGNOSIS — Z6841 Body Mass Index (BMI) 40.0 and over, adult: Secondary | ICD-10-CM

## 2021-05-06 DIAGNOSIS — R7989 Other specified abnormal findings of blood chemistry: Secondary | ICD-10-CM | POA: Diagnosis not present

## 2021-05-06 LAB — HEPATIC FUNCTION PANEL
ALT: 21 U/L (ref 0–35)
AST: 18 U/L (ref 0–37)
Albumin: 3.8 g/dL (ref 3.5–5.2)
Alkaline Phosphatase: 65 U/L (ref 39–117)
Bilirubin, Direct: 0.1 mg/dL (ref 0.0–0.3)
Total Bilirubin: 0.4 mg/dL (ref 0.2–1.2)
Total Protein: 7 g/dL (ref 6.0–8.3)

## 2021-05-06 MED ORDER — SAXENDA 18 MG/3ML ~~LOC~~ SOPN
PEN_INJECTOR | SUBCUTANEOUS | 0 refills | Status: DC
  Filled 2021-05-06 – 2021-06-16 (×4): qty 15, 28d supply, fill #0
  Filled 2021-06-16: qty 15, 30d supply, fill #0

## 2021-05-06 NOTE — Patient Instructions (Signed)
I have prescribed a new medication called Saxenda which you will take as directed on the container. Follow up In the next one month.   A referral was placed today for weight loss management. Please let us know if you have not heard back within 1 week about your referral.  Stop by the lab prior to leaving today. I will notify you of your results once received.   It was a pleasure seeing you today! Please do not hesitate to reach out with any questions and or concerns.  Regards,   Eugenia Pancoast FNP-C

## 2021-05-06 NOTE — Assessment & Plan Note (Signed)
Attempting to treat for obesity.

## 2021-05-06 NOTE — Assessment & Plan Note (Signed)
Repeat hepatic function panel today. Pending results.

## 2021-05-06 NOTE — Assessment & Plan Note (Signed)
Will see if we can get saxenda approved, if no will try wegovy. Also referral placed for weight loss management.  Pt to work on diet and exercise as well during this time.

## 2021-05-06 NOTE — Assessment & Plan Note (Signed)
Repeat vitamin D today. Pending results.

## 2021-05-06 NOTE — Progress Notes (Signed)
Established Patient Office Visit  Subjective:  Patient ID: Erika Henry, female    DOB: 01-01-1981  Age: 41 y.o. MRN: [id number]  CC:  Chief Complaint  Patient presents with   2 Week F/u for Wheezing    Pt states "I feel a whole lot better".    HPI Erika Henry is here today for follow up.  Pt is without acute concerns.  Pt states URI symtpoms have almost completely resolved, cough and sinus pressure are no longer. No sob or DOE.    Last lab work did reveal elevated eosinophils (was experiencing asthma sx at the time) TSH stable, on levothyroxine 112 mcg once daily. A1c WNL. Does want to test for Vitamin D today.   Elevated LFTS back in 7/22, has not repeated. No RUQ abodminal pain, doesn't drink ETOH. No recent ibuprofen or tylenol. No herbal supplements. Does drink/use herba-life daily and has been using it for over six months.   Pt does desire to lose weight and needs help as she has tried various methods herself without much relief (to include herbalife). She does not currently exercise as she just got over a bad URI, but will begin some throughout the week. She would like to look into starting saxenda.   Past Medical History:  Diagnosis Date   ASCUS with positive high risk HPV cervical 2004   neg colpo bx 2/04   GERD (gastroesophageal reflux disease)    Hypothyroid    Lichen sclerosus    Migraines    PCOS (polycystic ovarian syndrome)    Vaccine for human papilloma virus (HPV) types 6, 11, 16, and 18 administered     Past Surgical History:  Procedure Laterality Date   COLPOSCOPY  2004   WISDOM TOOTH EXTRACTION  2005    Family History  Problem Relation Age of Onset   Hyperlipidemia Mother    Hypertension Mother    Hyperlipidemia Father    Hypertension Father    Prostate cancer Father    Depression Brother    Depression Maternal Grandmother    Diabetes Maternal Grandmother    Stroke Maternal Grandmother    COPD Maternal Grandfather    Diabetes  Maternal Grandfather    Stroke Maternal Grandfather    Depression Paternal Grandmother    Breast cancer Paternal Grandmother 105   Arthritis Paternal Grandfather    Cancer Paternal Grandfather    Heart disease Paternal Grandfather    Hyperlipidemia Paternal Grandfather    Hypertension Paternal Grandfather    Kidney disease Paternal Grandfather     Social History   Socioeconomic History   Marital status: Married    Spouse name: Not on file   Number of children: Not on file   Years of education: Not on file   Highest education level: Not on file  Occupational History   Not on file  Tobacco Use   Smoking status: Never   Smokeless tobacco: Never  Vaping Use   Vaping Use: Never used  Substance and Sexual Activity   Alcohol use: Yes    Comment: ocassional   Drug use: Not Currently   Sexual activity: Yes    Birth control/protection: None, Condom  Other Topics Concern   Not on file  Social History Narrative   Married.   No children.   Works as a Technical brewer at [employer].   Enjoys exercising, shopping, bowling, traveling.   Social Determinants of Health   Financial Resource Strain: Not on file  Food Insecurity: Not on  file  Transportation Needs: Not on file  Physical Activity: Not on file  Stress: Not on file  Social Connections: Not on file  Intimate Partner Violence: Not on file    Outpatient Medications Prior to Visit  Medication Sig Dispense Refill   chlorhexidine (PERIDEX) 0.12 % solution Rinse with 15 ml by mouth for 1 minute twice a day, then expectorate 473 mL 0   Cholecalciferol (VITAMIN D3) 2000 units capsule Take by mouth.     clindamycin (CLEOCIN T) 1 % external solution Apply topically as directed. Apply once to twice daily to affected areas on neck and face as needed for flares 30 mL 2   clotrimazole-betamethasone (LOTRISONE) cream Apply externally twice a day for 2 weeks 15 g 0   fluticasone (FLOVENT HFA) 110 MCG/ACT inhaler Inhale 1 puff into the  lungs in the morning and at bedtime. 12 g 0   hydrocortisone 2.5 % cream Apply topically 2 (two) times daily as needed (Rash). For up to 2 weeks 28 g 1   ketoconazole (NIZORAL) 2 % cream Apply 1 application topically 2 (two) times daily. As needed for rash 30 g 3   ketoconazole (NIZORAL) 2 % shampoo USE TO SHAMPOO SCALP 3-7 TIMES PER WEEK. LEAVE ON FOR 5-10 MINUTES BEFORE RINSING OUT. 120 mL 11   levothyroxine (SYNTHROID) 112 MCG tablet Take 1 tablet by mouth every morning on an empty stomach with water only.  No food or other medications for 30 minutes. 90 tablet 1   minoxidil (ROGAINE) 2 % external solution Apply topically 2 (two) times daily.     Multiple Vitamin (MULTI-VITAMINS) TABS Take by mouth.     Tretinoin (ALTRENO) 0.05 % LOTN Apply nightly as tolerated. 45 g 11   predniSONE (DELTASONE) 20 MG tablet Take two tablets po qd for five days 10 tablet 0   No facility-administered medications prior to visit.    No Known Allergies  ROS Review of Systems  Review of Systems  Respiratory:  Negative for shortness of breath.   Cardiovascular:  Negative for chest pain and palpitations.  Gastrointestinal:  Negative for constipation and diarrhea.  Genitourinary:  Negative for dysuria, frequency and urgency.  Musculoskeletal:  Negative for myalgias.  Psychiatric/Behavioral:  Negative for depression and suicidal ideas.   All other systems reviewed and are negative.    Objective:    Physical Exam  Gen: NAD, resting comfortably HEENT: TMs normal bilaterally. OP clear. No thyromegaly noted.  CV: RRR with no murmurs appreciated Pulm: NWOB, CTAB with no crackles, wheezes, or rhonchi GI: Normal bowel sounds present. Soft, Nontender, Nondistended. MSK: no edema, cyanosis, or clubbing noted Skin: warm, dry Psych: Normal affect and thought content  BP 112/78 (BP Location: Left Arm, Patient Position: Sitting, Cuff Size: Large)    Pulse 72    Temp (!) 97.4 F (36.3 C)    Ht 5\' 2"  (1.575 m)     Wt 245 lb (111.1 kg)    LMP 04/22/2021 (Exact Date)    SpO2 96%    BMI 44.81 kg/m  Wt Readings from Last 3 Encounters:  05/06/21 245 lb (111.1 kg)  04/22/21 227 lb (103 kg)  03/24/21 234 lb (106.1 kg)     There are no preventive care reminders to display for this patient.  There are no preventive care reminders to display for this patient.  Lab Results  Component Value Date   TSH 2.25 04/22/2021   Lab Results  Component Value Date   WBC 7.2 04/22/2021  HGB 14.1 04/22/2021   HCT 41.9 04/22/2021   MCV 87.0 04/22/2021   PLT 371.0 04/22/2021   Lab Results  Component Value Date   NA 138 04/22/2021   K 4.6 04/22/2021   CO2 25 04/22/2021   GLUCOSE 88 04/22/2021   BUN 14 04/22/2021   CREATININE 0.77 04/22/2021   BILITOT 0.5 10/20/2020   ALKPHOS 85 10/20/2020   AST 47 (H) 10/20/2020   ALT 70 (H) 10/20/2020   PROT 7.0 10/20/2020   ALBUMIN 4.1 10/20/2020   CALCIUM 9.1 04/22/2021   ANIONGAP 7 03/30/2016   GFR 95.69 04/22/2021   Lab Results  Component Value Date   CHOL 182 10/20/2020   Lab Results  Component Value Date   HDL 43.70 10/20/2020   Lab Results  Component Value Date   LDLCALC 114 (H) 10/20/2020   Lab Results  Component Value Date   TRIG 123.0 10/20/2020   Lab Results  Component Value Date   CHOLHDL 4 10/20/2020   Lab Results  Component Value Date   HGBA1C 5.2 04/22/2021      Assessment & Plan:   Problem List Items Addressed This Visit       Other   Vitamin D deficiency disease    Repeat vitamin D today. Pending results.       Body mass index (BMI) of 45.0-49.9 in adult [facility])    Attempting to treat for obesity.       Relevant Medications   Liraglutide -Weight Management (SAXENDA) 18 MG/3ML SOPN   Morbid obesity ([place])    Will see if we can get saxenda approved, if no will try wegovy. Also referral placed for weight loss management.  Pt to work on diet and exercise as well during this time.       Relevant Medications   Liraglutide  -Weight Management (SAXENDA) 18 MG/3ML SOPN   Other Relevant Orders   Amb Ref to Medical Weight Management   Elevated liver function tests - Primary    Repeat hepatic function panel today. Pending results.        Meds ordered this encounter  Medications   Liraglutide -Weight Management (SAXENDA) 18 MG/3ML SOPN    Sig: Inject 0.6 mg into the skin daily for 7 days, THEN 1.2 mg daily for 7 days, THEN 1.8 mg daily for 7 days, THEN 2.4 mg daily for 7 days.    Dispense:  15 mL    Refill:  0    Order Specific Question:   Supervising Provider    Answer:   Diona Browner, AMY E [[id number]]    Follow-up: Return in about 1 month (around 06/03/2021) for follow up on medication.    Eugenia Pancoast, FNP

## 2021-05-11 ENCOUNTER — Telehealth: Payer: Self-pay | Admitting: *Deleted

## 2021-05-11 NOTE — Telephone Encounter (Signed)
Start prior autho saxenda 18mg /29ml pen-injector. CXF:[id number]

## 2021-05-13 ENCOUNTER — Other Ambulatory Visit: Payer: Self-pay

## 2021-05-25 NOTE — Progress Notes (Signed)
? ? ?Pleas Koch, NP ? ? ?Chief Complaint  ?Patient presents with  ? Vaginal Itching  ?  Irritation, no discharge or odor.  ? ? ?HPI: ?     [name] is a 41 y.o. G0P0000 whose LMP was Patient's last menstrual period was 05/16/2021 (approximate)., presents today for vaginal itching for several months. No increased vag d/c, odor.  Had sx 10/22 and given Rx lotrisone crm. Pt states it helped some but didn't fully go away. Tried OTC yeast crm ext without sx relief. Trying to keep area dry but does sweat with activity.  Is using sens skin soap, no dryer sheets, wearing regular cotton underwear, but is doing baths with lavender scented epsom salt baths. Chart says hx of LS but pt unsure where that came from.  ?She is sex active, no pain/bleeding. Menses monthly, lasting 6-7 days. Had light pink bleeding with wiping once a few days ago. Neg GYN u/s 11/22 except subseptate uterus.  ? ? ?Patient Active Problem List  ? Diagnosis Date Noted  ? Elevated liver function tests 05/06/2021  ? Morbid obesity ([place]) 04/22/2021  ? Vertigo 03/26/2018  ? PCOS (polycystic ovarian syndrome) 02/09/2017  ? Psoriasis 02/09/2017  ? Hypothyroidism (acquired) 05/24/2016  ? Vitamin D deficiency disease 05/24/2016  ? Body mass index (BMI) of 45.0-49.9 in adult [facility]) 05/24/2016  ? Gastro-esophageal reflux disease without esophagitis 08/02/2015  ? ? ?Past Surgical History:  ?Procedure Laterality Date  ? COLPOSCOPY  2004  ? [place] EXTRACTION  2005  ? ? ?Family History  ?Problem Relation Age of Onset  ? Hyperlipidemia Mother   ? Hypertension Mother   ? Hyperlipidemia Father   ? Hypertension Father   ? Prostate cancer Father   ? Depression Brother   ? Depression Maternal Grandmother   ? Diabetes Maternal Grandmother   ? Stroke Maternal Grandmother   ? COPD Maternal Grandfather   ? Diabetes Maternal Grandfather   ? Stroke Maternal Grandfather   ? Depression Paternal Grandmother   ? Breast cancer Paternal Grandmother 92  ?  Arthritis Paternal Grandfather   ? Heart disease Paternal Grandfather   ? Hyperlipidemia Paternal Grandfather   ? Hypertension Paternal Grandfather   ? Kidney disease Paternal Grandfather   ? Lung cancer Paternal Grandfather   ? ? ?Social History  ? ?Socioeconomic History  ? Marital status: Married  ?  Spouse name: Not on file  ? Number of children: Not on file  ? Years of education: Not on file  ? Highest education level: Not on file  ?Occupational History  ? Not on file  ?Tobacco Use  ? Smoking status: Never  ? Smokeless tobacco: Never  ?Vaping Use  ? Vaping Use: Never used  ?Substance and Sexual Activity  ? Alcohol use: Yes  ?  Comment: ocassional  ? Drug use: Not Currently  ? Sexual activity: Yes  ?  Birth control/protection: None, Condom  ?Other Topics Concern  ? Not on file  ?Social History Narrative  ? Married.  ? No children.  ? Works as a Technical brewer at [employer].  ? Enjoys exercising, shopping, bowling, traveling.  ? ?Social Determinants of Health  ? ?Financial Resource Strain: Not on file  ?Food Insecurity: Not on file  ?Transportation Needs: Not on file  ?Physical Activity: Not on file  ?Stress: Not on file  ?Social Connections: Not on file  ?Intimate Partner Violence: Not on file  ? ? ?Outpatient Medications Prior to Visit  ?Medication  Sig Dispense Refill  ? chlorhexidine (PERIDEX) 0.12 % solution Rinse with 15 ml by mouth for 1 minute twice a day, then expectorate 473 mL 0  ? Cholecalciferol (VITAMIN D3) 2000 units capsule Take by mouth.    ? clindamycin (CLEOCIN T) 1 % external solution Apply topically as directed. Apply once to twice daily to affected areas on neck and face as needed for flares 30 mL 2  ? fluticasone (FLOVENT HFA) 110 MCG/ACT inhaler Inhale 1 puff into the lungs in the morning and at bedtime. 12 g 0  ? hydrocortisone 2.5 % cream Apply topically 2 (two) times daily as needed (Rash). For up to 2 weeks 28 g 1  ? ketoconazole (NIZORAL) 2 % cream Apply 1 application topically 2 (two)  times daily. As needed for rash 30 g 3  ? ketoconazole (NIZORAL) 2 % shampoo USE TO SHAMPOO SCALP 3-7 TIMES PER WEEK. LEAVE ON FOR 5-10 MINUTES BEFORE RINSING OUT. 120 mL 11  ? levothyroxine (SYNTHROID) 112 MCG tablet Take 1 tablet by mouth every morning on an empty stomach with water only.  No food or other medications for 30 minutes. 90 tablet 1  ? minoxidil (ROGAINE) 2 % external solution Apply topically 2 (two) times daily.    ? Multiple Vitamin (MULTI-VITAMINS) TABS Take by mouth.    ? Tretinoin (ALTRENO) 0.05 % LOTN Apply nightly as tolerated. 45 g 11  ? clotrimazole-betamethasone (LOTRISONE) cream Apply externally twice a day for 2 weeks 15 g 0  ? Liraglutide -Weight Management (SAXENDA) 18 MG/3ML SOPN Inject 0.6 mg into the skin daily for 7 days, THEN 1.2 mg daily for 7 days, THEN 1.8 mg daily for 7 days, THEN 2.4 mg daily for 7 days. (Patient not taking: Reported on 05/26/2021) 15 mL 0  ? ?No facility-administered medications prior to visit.  ? ? ? ? ?ROS: ? ?Review of Systems  ?Constitutional:  Negative for fever.  ?Gastrointestinal:  Negative for blood in stool, constipation, diarrhea, nausea and vomiting.  ?Genitourinary:  Negative for dyspareunia, dysuria, flank pain, frequency, hematuria, urgency, vaginal bleeding, vaginal discharge and vaginal pain.  ?Musculoskeletal:  Negative for back pain.  ?Skin:  Negative for rash.  ?BREAST: No symptoms ? ? ?OBJECTIVE:  ? ?Vitals:  ?BP 128/80   Ht 5\' 2"  (1.575 m)   Wt 247 lb (112 kg)   LMP 05/16/2021 (Approximate)   BMI 45.18 kg/m?  ? ?Physical Exam ?Vitals reviewed.  ?Constitutional:   ?   Appearance: She is well-developed.  ?Pulmonary:  ?   Effort: Pulmonary effort is normal.  ?Genitourinary: ?   Pubic Area: No rash.   ?   Labia:     ?   Right: Rash present. No tenderness or lesion.     ?   Left: Rash present. No tenderness or lesion.   ?   Vagina: Normal. No vaginal discharge, erythema or tenderness.  ?   Cervix: Normal.  ?   Uterus: Normal. Not enlarged and  not tender.   ?   Adnexa: Right adnexa normal and left adnexa normal.    ?   Right: No mass or tenderness.      ?   Left: No mass or tenderness.    ?   Comments: BILAT LABIA MINORA WITH MILD HYPERTROPHY, MINIMAL ERYTHEMA, CLITORIS WITH SLIGHT HYPERTROPHY; NO FISSURES, NO D/C ?Musculoskeletal:     ?   General: Normal range of motion.  ?   Cervical back: Normal range of motion.  ?Skin: ?  General: Skin is warm and dry.  ?Neurological:  ?   General: No focal deficit present.  ?   Mental Status: She is alert and oriented to person, place, and time.  ?Psychiatric:     ?   Mood and Affect: Mood normal.     ?   Behavior: Behavior normal.     ?   Thought Content: Thought content normal.     ?   Judgment: Judgment normal.  ? ? ?Results: ?Results for orders placed or performed in visit on 05/26/21 (from the past 24 hour(s))  ?POCT Wet Prep with KOH     Status: Normal  ? Collection Time: 05/26/21  9:37 AM  ?Result Value Ref Range  ? Trichomonas, UA Negative   ? Clue Cells Wet Prep HPF POC neg   ? Epithelial Wet Prep HPF POC    ? Yeast Wet Prep HPF POC neg   ? Bacteria Wet Prep HPF POC    ? RBC Wet Prep HPF POC    ? WBC Wet Prep HPF POC    ? KOH Prep POC Negative Negative  ? ? ? ?Assessment/Plan: ?Vaginal itching - Plan: clotrimazole-betamethasone (LOTRISONE) cream, POCT Wet Prep with KOH; neg wet prep, neg exam except mild hypertrophy. Rx lotisone crm, no evid of LS. Question chem derm with lavender epsom salt baths. D/C for now. Keep area dry.If sx persist, will try Rx steroid crm to calm irritation. F/u prn.  ? ? ?Meds ordered this encounter  ?Medications  ? clotrimazole-betamethasone (LOTRISONE) cream  ?  Sig: Apply externally twice a day for 2 weeks  ?  Dispense:  45 g  ?  Refill:  0  ?  Order Specific Question:   Supervising Provider  ?  AnswerGae Dry [[id number]]  ? ? ? ? Return if symptoms worsen or fail to improve. ? ?Quintyn Dombek B. Yasmin Dibello, PA-C ?05/26/2021 ?9:39 AM ? ? ? ? ? ?

## 2021-05-26 ENCOUNTER — Encounter: Payer: Self-pay | Admitting: Obstetrics and Gynecology

## 2021-05-26 ENCOUNTER — Other Ambulatory Visit: Payer: Self-pay

## 2021-05-26 ENCOUNTER — Ambulatory Visit (INDEPENDENT_AMBULATORY_CARE_PROVIDER_SITE_OTHER): Payer: 59 | Admitting: Obstetrics and Gynecology

## 2021-05-26 VITALS — BP 128/80 | Ht 62.0 in | Wt 247.0 lb

## 2021-05-26 DIAGNOSIS — N898 Other specified noninflammatory disorders of vagina: Secondary | ICD-10-CM

## 2021-05-26 LAB — POCT WET PREP WITH KOH
Clue Cells Wet Prep HPF POC: NEGATIVE
KOH Prep POC: NEGATIVE
Trichomonas, UA: NEGATIVE
Yeast Wet Prep HPF POC: NEGATIVE

## 2021-05-26 MED ORDER — CLOTRIMAZOLE-BETAMETHASONE 1-0.05 % EX CREA
TOPICAL_CREAM | CUTANEOUS | 0 refills | Status: DC
  Filled 2021-05-26: qty 45, 14d supply, fill #0

## 2021-06-01 ENCOUNTER — Encounter: Payer: Self-pay | Admitting: Family

## 2021-06-01 NOTE — Telephone Encounter (Signed)
PA--for saxenda 18 mg/60m pen injector been denied guidelines rules:when  used for weight loss or weight management, our guideline named anti-obesity requires that pt are actively enrolled in an exercise and caloric reduction program or weight loss/behavior modification program.  ?

## 2021-06-01 NOTE — Telephone Encounter (Signed)
PA--for saxenda 18 mg/24m pen injector been denied guidelines rules:when  used for weight loss or weight management, our guideline named anti-obesity requires that pt are actively enrolled in an exercise and caloric reduction program or weight loss/behavior modification program. Letter was placed [place]. ?

## 2021-06-02 ENCOUNTER — Other Ambulatory Visit: Payer: Self-pay | Admitting: Family

## 2021-06-03 ENCOUNTER — Other Ambulatory Visit: Payer: Self-pay

## 2021-06-03 NOTE — Telephone Encounter (Signed)
Appeal--been aprroved  for saxenda '180mg'$ /3 ml pen 06/01/2021-09/30/2021 maximum 4 refills. ?

## 2021-06-06 ENCOUNTER — Other Ambulatory Visit: Payer: Self-pay

## 2021-06-07 ENCOUNTER — Other Ambulatory Visit: Payer: Self-pay

## 2021-06-07 MED ORDER — BIMATOPROST 0.03 % EX SOLN: 1.0000 "application " | Freq: Every day | CUTANEOUS | 12 refills | Status: AC

## 2021-06-16 ENCOUNTER — Other Ambulatory Visit: Payer: Self-pay

## 2021-06-16 ENCOUNTER — Encounter: Payer: Self-pay | Admitting: Pharmacist

## 2021-06-16 MED ORDER — INSULIN PEN NEEDLE 32G X 4 MM MISC
0 refills | Status: DC
  Filled 2021-06-16: qty 100, 90d supply, fill #0

## 2021-06-16 NOTE — Telephone Encounter (Signed)
Appeal--been aprroved  for saxenda '180mg'$ /3 ml pen 06/01/2021-09/30/2021 maximum 4 refills. ?

## 2021-06-18 ENCOUNTER — Other Ambulatory Visit: Payer: Self-pay | Admitting: Family

## 2021-06-18 MED ORDER — SAXENDA 18 MG/3ML ~~LOC~~ SOPN
PEN_INJECTOR | SUBCUTANEOUS | 0 refills | Status: AC
  Filled 2021-06-18: qty 15, 30d supply, fill #0

## 2021-06-20 ENCOUNTER — Other Ambulatory Visit: Payer: Self-pay

## 2021-06-23 ENCOUNTER — Other Ambulatory Visit: Payer: Self-pay

## 2021-06-23 ENCOUNTER — Telehealth: Payer: Self-pay

## 2021-06-23 DIAGNOSIS — R21 Rash and other nonspecific skin eruption: Secondary | ICD-10-CM

## 2021-06-23 MED ORDER — KETOCONAZOLE 2 % EX CREA
1.0000 "application " | TOPICAL_CREAM | Freq: Every day | CUTANEOUS | 0 refills | Status: AC
  Filled 2021-06-23: qty 60, 20d supply, fill #0

## 2021-06-23 MED ORDER — MOMETASONE FUROATE 0.1 % EX CREA
1.0000 "application " | TOPICAL_CREAM | Freq: Every day | CUTANEOUS | 0 refills | Status: DC | PRN
  Filled 2021-06-23: qty 45, 20d supply, fill #0

## 2021-06-23 NOTE — Telephone Encounter (Signed)
Blastomycetes Interdigitalis of left hand ? ?Start Ketoconazole 2% cream qd, Mometasone cream qd until clear ?

## 2021-07-05 ENCOUNTER — Other Ambulatory Visit: Payer: Self-pay

## 2021-07-05 ENCOUNTER — Ambulatory Visit
Admission: RE | Admit: 2021-07-05 | Discharge: 2021-07-05 | Disposition: A | Discharge status: Home / Self Care | Payer: 59 | Source: Ambulatory Visit | Attending: Student | Admitting: Student

## 2021-07-05 VITALS — BP 143/93 | HR 72 | Temp 98.1°F | Resp 18

## 2021-07-05 DIAGNOSIS — R1011 Right upper quadrant pain: Secondary | ICD-10-CM | POA: Diagnosis not present

## 2021-07-05 LAB — POCT URINALYSIS DIP (MANUAL ENTRY)
Bilirubin, UA: NEGATIVE
Glucose, UA: NEGATIVE mg/dL
Ketones, POC UA: NEGATIVE mg/dL
Leukocytes, UA: NEGATIVE
Nitrite, UA: NEGATIVE
Protein Ur, POC: NEGATIVE mg/dL
Spec Grav, UA: 1.015 (ref 1.010–1.025)
Urobilinogen, UA: 0.2 E.U./dL
pH, UA: 6 (ref 5.0–8.0)

## 2021-07-05 LAB — POCT URINE PREGNANCY: Preg Test, Ur: NEGATIVE

## 2021-07-05 MED ORDER — LANSOPRAZOLE 15 MG PO CPDR
15.0000 mg | DELAYED_RELEASE_CAPSULE | Freq: Every day | ORAL | 0 refills | Status: DC
  Filled 2021-07-05: qty 7, 7d supply, fill #0

## 2021-07-05 NOTE — ED Triage Notes (Signed)
Pt c/o mid abdominal pain, right abdominal tenderness and back pain since this morning. She states it felt like indigestion and hurts worse when she is sitting. She vomited twice today. Pt denies any urinary symptoms.  ?

## 2021-07-05 NOTE — Discharge Instructions (Signed)
-  It is a good sign that your pain has improved at the time of this visit.  It is possible that your pain is from indigestion.  We are trying an acid reducer for 7 days to see if this helps, and avoid spicy food, ibuprofen, coffee on an empty stomach, alcohol.  You can also take Tums as needed. ?-I cannot exclude other causes of abdominal pain including gallbladder disease in the urgent care setting.  Please follow-up with your primary care provider if symptoms persist, or head to the emergency department if symptoms worsen.  This includes abdominal pain, new vomiting, blood in your vomit, new fevers, etc. ?

## 2021-07-05 NOTE — ED Provider Notes (Signed)
?[facility] ? ? ? ?CSN: [id number] ?Arrival date & time: 07/05/21  1148 ? ? ?  ? ?History   ?Chief Complaint ?Chief Complaint  ?Patient presents with  ? Nausea  ? Abdominal Pain  ? Emesis  ? ? ?HPI ?Erika Henry is a 41 y.o. female presenting with abdominal pain since waking up  (6 hours), which has largely resolved at the time of this visit. History GERD.  Describes waking up with a pain in the epigastric and right upper quadrant regions, with radiation to her back.  At that time, she felt nausea without vomiting.  She states that the episode occurred when she had an empty stomach, and seem to improve following Tums.  She has tolerated some fluid and food since then.  States bowel movements are regular.  No prior history of abdominal procedures.  She finished her menstrual period 2 days ago. Denies spicy food, alcohol, coffee on empty stomach.  ? ?HPI ? ?Past Medical History:  ?Diagnosis Date  ? ASCUS with positive high risk HPV cervical 2004  ? neg colpo bx 2/04  ? GERD (gastroesophageal reflux disease)   ? Hypothyroid   ? Lichen sclerosus   ? Migraines   ? PCOS (polycystic ovarian syndrome)   ? Vaccine for human papilloma virus (HPV) types 6, 11, 16, and 18 administered   ? ? ?Patient Active Problem List  ? Diagnosis Date Noted  ? Elevated liver function tests 05/06/2021  ? Morbid obesity ([place]) 04/22/2021  ? Vertigo 03/26/2018  ? PCOS (polycystic ovarian syndrome) 02/09/2017  ? Psoriasis 02/09/2017  ? Hypothyroidism (acquired) 05/24/2016  ? Vitamin D deficiency disease 05/24/2016  ? Body mass index (BMI) of 45.0-49.9 in adult [facility]) 05/24/2016  ? Gastro-esophageal reflux disease without esophagitis 08/02/2015  ? ? ?Past Surgical History:  ?Procedure Laterality Date  ? COLPOSCOPY  2004  ? [place] EXTRACTION  2005  ? ? ?OB History   ? ? Gravida  ?0  ? Para  ?0  ? Term  ?0  ? Preterm  ?0  ? AB  ?0  ? Living  ?0  ?  ? ? SAB  ?0  ? IAB  ?0  ? Ectopic  ?0  ? Multiple  ?0  ? Live Births  ?0  ?   ?  ?   ? ? ? ?Home Medications   ? ?Prior to Admission medications   ?Medication Sig Start Date End Date Taking? Authorizing Provider  ?lansoprazole (PREVACID) 15 MG capsule Take 1 capsule (15 mg total) by mouth daily at 12 noon. 07/05/21  Yes Hazel Sams, PA-C  ?bimatoprost (LATISSE) 0.03 % ophthalmic solution Place 1 application. into both eyes at bedtime. Place one drop on applicator and apply evenly along the skin of the upper eyelid at base of eyelashes once daily at bedtime; repeat procedure for second eye (use a clean applicator). 06/07/21   Brendolyn Patty, MD  ?chlorhexidine (PERIDEX) 0.12 % solution Rinse with 15 ml by mouth for 1 minute twice a day, then expectorate 04/22/21     ?Cholecalciferol (VITAMIN D3) 2000 units capsule Take by mouth.    [provider]  ?clindamycin (CLEOCIN T) 1 % external solution Apply topically as directed. Apply once to twice daily to affected areas on neck and face as needed for flares 09/16/20 09/16/21  Brendolyn Patty, MD  ?clotrimazole-betamethasone (LOTRISONE) cream Apply externally twice a day for 2 weeks 0/8/65   Copland, Alicia B, PA-C  ?fluticasone (FLOVENT HFA) 110  MCG/ACT inhaler Inhale 1 puff into the lungs in the morning and at bedtime. 04/22/21 06/21/21  Eugenia Pancoast, FNP  ?hydrocortisone 2.5 % cream Apply topically 2 (two) times daily as needed (Rash). For up to 2 weeks 04/27/21   Laurence Ferrari, [place], MD  ?Insulin Pen Needle 32G X 4 MM MISC Use with Saxenda 06/16/21   Eugenia Pancoast, FNP  ?ketoconazole (NIZORAL) 2 % cream Apply 1 application. topically daily. 06/23/21 08/04/21  Ralene Bathe, MD  ?levothyroxine (SYNTHROID) 112 MCG tablet Take 1 tablet by mouth every morning on an empty stomach with water only.  No food or other medications for 30 minutes. 04/29/21   Pleas Koch, NP  ?Liraglutide -Weight Management (SAXENDA) 18 MG/3ML SOPN Inject 0.6 mg into the skin daily for 7 days, THEN 1.2 mg daily for 7 days, THEN 1.8 mg daily for 7 days, THEN 2.4 mg daily for  7 days. 06/18/21 07/16/21  Eugenia Pancoast, FNP  ?minoxidil (ROGAINE) 2 % external solution Apply topically 2 (two) times daily.    [provider]  ?mometasone (ELOCON) 0.1 % cream Apply 1 application. topically daily as needed (Rash). 06/23/21   Ralene Bathe, MD  ?Multiple Vitamin (MULTI-VITAMINS) TABS Take by mouth.    [provider]  ?Tretinoin (ALTRENO) 0.05 % LOTN Apply nightly as tolerated. 04/18/21   Moye, [place], MD  ? ? ?Family History ?Family History  ?Problem Relation Age of Onset  ? Hyperlipidemia Mother   ? Hypertension Mother   ? Hyperlipidemia Father   ? Hypertension Father   ? Prostate cancer Father   ? Depression Brother   ? Depression Maternal Grandmother   ? Diabetes Maternal Grandmother   ? Stroke Maternal Grandmother   ? COPD Maternal Grandfather   ? Diabetes Maternal Grandfather   ? Stroke Maternal Grandfather   ? Depression Paternal Grandmother   ? Breast cancer Paternal Grandmother 7  ? Arthritis Paternal Grandfather   ? Heart disease Paternal Grandfather   ? Hyperlipidemia Paternal Grandfather   ? Hypertension Paternal Grandfather   ? Kidney disease Paternal Grandfather   ? Lung cancer Paternal Grandfather   ? ? ?Social History ?Social History  ? ?Tobacco Use  ? Smoking status: Never  ? Smokeless tobacco: Never  ?Vaping Use  ? Vaping Use: Never used  ?Substance Use Topics  ? Alcohol use: Yes  ?  Comment: ocassional  ? Drug use: Not Currently  ? ? ? ?Allergies   ?Patient has no known allergies. ? ? ?Review of Systems ?Review of Systems  ?Gastrointestinal:  Positive for abdominal pain.  ?All other systems reviewed and are negative. ? ? ?Physical Exam ?Triage Vital Signs ?ED Triage Vitals  ?Enc Vitals Group  ?   BP 07/05/21 1202 (!) 143/93  ?   Pulse Rate 07/05/21 1202 72  ?   Resp 07/05/21 1202 18  ?   Temp 07/05/21 1202 98.1 ?F (36.7 ?C)  ?   Temp Source 07/05/21 1202 Oral  ?   SpO2 07/05/21 1202 95 %  ?   Weight --   ?   Height --   ?   Head Circumference --   ?   Peak  Flow --   ?   Pain Score 07/05/21 1200 3  ?   Pain Loc --   ?   Pain Edu? --   ?   Excl. in [place]? --   ? ?No data found. ? ?Updated Vital Signs ?BP (!) 143/93 (BP Location: Left Wrist)  Pulse 72   Temp 98.1 ?F (36.7 ?C) (Oral)   Resp 18   LMP 06/28/2021 (Approximate)   SpO2 95%  ? ?Visual Acuity ?Right Eye Distance:   ?Left Eye Distance:   ?Bilateral Distance:   ? ?Right Eye Near:   ?Left Eye Near:    ?Bilateral Near:    ? ?Physical Exam ?Vitals reviewed.  ?Constitutional:   ?   General: She is not in acute distress. ?   Appearance: Normal appearance. She is obese. She is not ill-appearing.  ?HENT:  ?   Head: Normocephalic and atraumatic.  ?   Mouth/Throat:  ?   Mouth: Mucous membranes are moist.  ?   Comments: Moist mucous membranes ?Eyes:  ?   Extraocular Movements: Extraocular movements intact.  ?   Pupils: Pupils are equal, round, and reactive to light.  ?Cardiovascular:  ?   Rate and Rhythm: Normal rate and regular rhythm.  ?   Pulses:     ?     Radial pulses are 2+ on the right side and 2+ on the left side.  ?   Heart sounds: Normal heart sounds.  ?   Comments: Radial pulses equal and regular ?Pulmonary:  ?   Effort: Pulmonary effort is normal.  ?   Breath sounds: Normal breath sounds. No wheezing, rhonchi or rales.  ?Abdominal:  ?   General: Bowel sounds are normal. There is no distension.  ?   Palpations: Abdomen is soft. There is no mass.  ?   Tenderness: There is no abdominal tenderness. There is no right CVA tenderness, left CVA tenderness, guarding or rebound.  ?   Comments: Exam limited due to body habitus. No reproducible pain. No guarding, rebound, mass, hernia. Comfortable throughout exam. BS positive throughout.   ?Skin: ?   General: Skin is warm.  ?   Capillary Refill: Capillary refill takes less than 2 seconds.  ?   Comments: Good skin turgor  ?Neurological:  ?   General: No focal deficit present.  ?   Mental Status: She is alert and oriented to person, place, and time.  ?Psychiatric:     ?    Mood and Affect: Mood normal.     ?   Behavior: Behavior normal.  ? ? ? ?UC Treatments / Results  ?Labs ?(all labs ordered are listed, but only abnormal results are displayed) ?Labs Reviewed  ?[facility]

## 2021-07-19 ENCOUNTER — Other Ambulatory Visit: Payer: Self-pay

## 2021-07-29 ENCOUNTER — Other Ambulatory Visit: Payer: Self-pay

## 2021-08-01 ENCOUNTER — Other Ambulatory Visit: Payer: Self-pay

## 2021-08-09 ENCOUNTER — Other Ambulatory Visit: Payer: Self-pay

## 2021-08-23 ENCOUNTER — Other Ambulatory Visit: Payer: Self-pay

## 2021-10-07 ENCOUNTER — Telehealth: Payer: Self-pay

## 2021-10-07 NOTE — Telephone Encounter (Signed)
Isn't this something that needs to be done with you? A CPE?

## 2021-10-07 NOTE — Telephone Encounter (Signed)
Please call patient and add her in at her earliest convenience for CPE.

## 2021-10-07 NOTE — Telephone Encounter (Signed)
Patient is calling in wanting to reschedule her physical with Anda Kraft that was scheduled on 7/28. Patient's last CPE was 7/27, and due to Anda Kraft being out of the office and insurance purposes she is needing to be done before August 1st. Asking if she can see Tabitha for this physical.

## 2021-10-10 NOTE — Telephone Encounter (Signed)
Left message for pt to call the office and Erika Henry to get a CPE with Anda Kraft. Advised she said to fit her in at the pt's convenience. As noted below, her last CPE was 10-20-20. Maybe there is something available 10-21-21.

## 2021-10-21 ENCOUNTER — Encounter: Payer: 59 | Admitting: Primary Care

## 2021-10-22 ENCOUNTER — Telehealth: Payer: 59 | Admitting: Urgent Care

## 2021-10-22 DIAGNOSIS — L089 Local infection of the skin and subcutaneous tissue, unspecified: Secondary | ICD-10-CM

## 2021-10-22 DIAGNOSIS — B9689 Other specified bacterial agents as the cause of diseases classified elsewhere: Secondary | ICD-10-CM | POA: Diagnosis not present

## 2021-10-22 MED ORDER — SULFAMETHOXAZOLE-TRIMETHOPRIM 800-160 MG PO TABS: 1.0000 | ORAL_TABLET | Freq: Two times a day (BID) | ORAL | 0 refills | Status: AC

## 2021-10-22 MED ORDER — MUPIROCIN 2 % EX OINT: 1.0000 | TOPICAL_OINTMENT | Freq: Three times a day (TID) | CUTANEOUS | 0 refills | Status: DC

## 2021-10-22 MED ORDER — CEPHALEXIN 500 MG PO CAPS: 500.0000 mg | ORAL_CAPSULE | Freq: Four times a day (QID) | ORAL | 0 refills | Status: AC

## 2021-10-22 NOTE — Patient Instructions (Signed)
Erika Henry, thank you for joining Chaney Malling, PA for today's virtual visit.  While this provider is not your primary care provider (PCP), if your PCP is located in our provider database this encounter information will be shared with them immediately following your visit.  Consent: (Patient) Erika Henry provided verbal consent for this virtual visit at the beginning of the encounter.  Current Medications:  Current Outpatient Medications:    cephALEXin (KEFLEX) 500 MG capsule, Take 1 capsule (500 mg total) by mouth 4 (four) times daily for 5 days., Disp: 20 capsule, Rfl: 0   mupirocin ointment (BACTROBAN) 2 %, Apply 1 Application topically 3 (three) times daily., Disp: 22 g, Rfl: 0   sulfamethoxazole-trimethoprim (BACTRIM DS) 800-160 MG tablet, Take 1 tablet by mouth 2 (two) times daily for 5 days., Disp: 10 tablet, Rfl: 0   bimatoprost (LATISSE) 0.03 % ophthalmic solution, Place 1 application. into both eyes at bedtime. Place one drop on applicator and apply evenly along the skin of the upper eyelid at base of eyelashes once daily at bedtime; repeat procedure for second eye (use a clean applicator)., Disp: 5 mL, Rfl: 12   Cholecalciferol (VITAMIN D3) 2000 units capsule, Take by mouth., Disp: , Rfl:    clotrimazole-betamethasone (LOTRISONE) cream, Apply externally twice a day for 2 weeks, Disp: 45 g, Rfl: 0   hydrocortisone 2.5 % cream, Apply topically 2 (two) times daily as needed (Rash). For up to 2 weeks, Disp: 28 g, Rfl: 1   levothyroxine (SYNTHROID) 112 MCG tablet, Take 1 tablet by mouth every morning on an empty stomach with water only.  No food or other medications for 30 minutes., Disp: 90 tablet, Rfl: 1   minoxidil (ROGAINE) 2 % external solution, Apply topically 2 (two) times daily., Disp: , Rfl:    mometasone (ELOCON) 0.1 % cream, Apply 1 application. topically daily as needed (Rash)., Disp: 45 g, Rfl: 0   Multiple Vitamin (MULTI-VITAMINS) TABS, Take by mouth., Disp: ,  Rfl:    Tretinoin (ALTRENO) 0.05 % LOTN, Apply nightly as tolerated., Disp: 45 g, Rfl: 11   Medications ordered in this encounter:  Meds ordered this encounter  Medications   cephALEXin (KEFLEX) 500 MG capsule    Sig: Take 1 capsule (500 mg total) by mouth 4 (four) times daily for 5 days.    Dispense:  20 capsule    Refill:  0    Order Specific Question:   Supervising Provider    Answer:   Sabra Heck, BRIAN [[id number]]   sulfamethoxazole-trimethoprim (BACTRIM DS) 800-160 MG tablet    Sig: Take 1 tablet by mouth 2 (two) times daily for 5 days.    Dispense:  10 tablet    Refill:  0    Order Specific Question:   Supervising Provider    Answer:   Sabra Heck, BRIAN [[id number]]   mupirocin ointment (BACTROBAN) 2 %    Sig: Apply 1 Application topically 3 (three) times daily.    Dispense:  22 g    Refill:  0    Order Specific Question:   Supervising Provider    Answer:   Sabra Heck, [place]     *If you need refills on other medications prior to your next appointment, please contact your pharmacy*  Follow-Up: Call back or seek an in-person evaluation if the symptoms worsen or if the condition fails to improve as anticipated.  Other Instructions Please start taking cephalexin 4x/ day and bactrim 2x/ day until gone. Apply a warm compress  to the area three times daily. Apply topical mupirocin after each compress Draw a line around the red area and monitor for improvement. If the redness extends past the line, if you develop a single streak going up your leg, or if you develop systemic symptoms including fever or fatigue, please follow up at an in person UC or ER.   If you have been instructed to have an in-person evaluation today at a local Urgent Care facility, please use the link below. It will take you to a list of all of our available [facility], including address, phone number and hours of operation. Please do not delay care.  [place] [facility] [facility]  If you or a family member do not  have a primary care provider, use the link below to schedule a visit and establish care. When you choose a [facility] primary care physician or advanced practice provider, you gain a long-term partner in health. Find a Primary Care Provider  Learn more about [place]'s in-office and virtual care options: [facility]

## 2021-10-22 NOTE — Progress Notes (Signed)
Virtual Visit Consent   Erika Henry, you are scheduled for a virtual visit with a Erika Henry provider today. Just as with appointments in the office, your consent must be obtained to participate. Your consent will be active for this visit and any virtual visit you may have with one of our providers in the next 365 days. If you have a MyChart account, a copy of this consent can be sent to you electronically.  As this is a virtual visit, video technology does not allow for your provider to perform a traditional examination. This may limit your provider's ability to fully assess your condition. If your provider identifies any concerns that need to be evaluated in person or the need to arrange testing (such as labs, EKG, etc.), we will make arrangements to do so. Although advances in technology are sophisticated, we cannot ensure that it will always work on either your end or our end. If the connection with a video visit is poor, the visit may have to be switched to a telephone visit. With either a video or telephone visit, we are not always able to ensure that we have a secure connection.  By engaging in this virtual visit, you consent to the provision of healthcare and authorize for your insurance to be billed (if applicable) for the services provided during this visit. Depending on your insurance coverage, you may receive a charge related to this service.  I need to obtain your verbal consent now. Are you willing to proceed with your visit today? Erika Henry has provided verbal consent on 10/22/2021 for a virtual visit (video or telephone). Erika Malling, PA  Date: 10/22/2021 2:44 PM  Virtual Visit via Video Note   I, Erika Henry, connected with  Erika Henry  ([id number], [birth year]) on 10/22/21 at  2:30 PM EDT by a video-enabled telemedicine application and verified that I am speaking with the correct person using two identifiers.  Location: Patient: Virtual Visit Location  Patient: Home Provider: Virtual Visit Location Provider: Home Office   I discussed the limitations of evaluation and management by telemedicine and the availability of in person appointments. The patient expressed understanding and agreed to proceed.    History of Present Illness: Erika Henry is a 41 y.o. who identifies as a female who was assigned female at birth, and is being seen today for a painful skin lesion to L hip.  HPI: Pleasant 41yo female presents today for concern of a painful skin bump on her L hip, which she noted first yesterday. It is located near the waist and states her pants rub on it. There is only one lesion which has not grown in size but has become more painful and has had scant drainage. She applied topical OTC cream without relief. Denies any sick contacts, recent tick exposures, no outdoor/ yard work. She denies any systemic symptoms including fever, weakness, lethargy or abdominal cramping.   Problems:  Patient Active Problem List   Diagnosis Date Noted   Elevated liver function tests 05/06/2021   Morbid obesity ([place]) 04/22/2021   Vertigo 03/26/2018   PCOS (polycystic ovarian syndrome) 02/09/2017   Psoriasis 02/09/2017   Hypothyroidism (acquired) 05/24/2016   Vitamin D deficiency disease 05/24/2016   Body mass index (BMI) of 45.0-49.9 in adult [facility]) 05/24/2016   Gastro-esophageal reflux disease without esophagitis 08/02/2015    Allergies: No Known Allergies Medications:  Current Outpatient Medications:    cephALEXin (KEFLEX) 500 MG capsule, Take 1 capsule (500 mg  total) by mouth 4 (four) times daily for 5 days., Disp: 20 capsule, Rfl: 0   mupirocin ointment (BACTROBAN) 2 %, Apply 1 Application topically 3 (three) times daily., Disp: 22 g, Rfl: 0   sulfamethoxazole-trimethoprim (BACTRIM DS) 800-160 MG tablet, Take 1 tablet by mouth 2 (two) times daily for 5 days., Disp: 10 tablet, Rfl: 0   bimatoprost (LATISSE) 0.03 % ophthalmic solution, Place 1  application. into both eyes at bedtime. Place one drop on applicator and apply evenly along the skin of the upper eyelid at base of eyelashes once daily at bedtime; repeat procedure for second eye (use a clean applicator)., Disp: 5 mL, Rfl: 12   Cholecalciferol (VITAMIN D3) 2000 units capsule, Take by mouth., Disp: , Rfl:    clotrimazole-betamethasone (LOTRISONE) cream, Apply externally twice a day for 2 weeks, Disp: 45 g, Rfl: 0   hydrocortisone 2.5 % cream, Apply topically 2 (two) times daily as needed (Rash). For up to 2 weeks, Disp: 28 g, Rfl: 1   levothyroxine (SYNTHROID) 112 MCG tablet, Take 1 tablet by mouth every morning on an empty stomach with water only.  No food or other medications for 30 minutes., Disp: 90 tablet, Rfl: 1   minoxidil (ROGAINE) 2 % external solution, Apply topically 2 (two) times daily., Disp: , Rfl:    mometasone (ELOCON) 0.1 % cream, Apply 1 application. topically daily as needed (Rash)., Disp: 45 g, Rfl: 0   Multiple Vitamin (MULTI-VITAMINS) TABS, Take by mouth., Disp: , Rfl:    Tretinoin (ALTRENO) 0.05 % LOTN, Apply nightly as tolerated., Disp: 45 g, Rfl: 11  Observations/Objective: Patient is well-developed, well-nourished in no acute distress.  Resting comfortably on chair at home. Non-toxic Head is normocephalic, atraumatic.  No diaphoresis No labored breathing. No coughing Speech is clear and coherent with logical content.  Patient is alert and oriented at baseline.  Single skin lesion appears the size of a quarter to L superior hip near waist. Vibrant red which appears slightly raised with scant clear discharge, but no apparent abscess from limited visibility via video. No lymphangitis. No FB.  Assessment and Plan: 1. Skin infection, bacterial  Appears to be localized skin infection. Ddx include developing cellulitis vs erysipelas, MRSA, furuncle, infected ingrown hair. I do not appreciate an abscess although this is a differential. Will start PO abx to  cover for both MSSA and MRSA. Pt to use topical mupirocin and warm compresses. 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   Follow Up Instructions: I discussed the assessment and treatment plan with the patient. The patient was provided an opportunity to ask questions and all were answered. The patient agreed with the plan and demonstrated an understanding of the instructions.  A copy of instructions were sent to the patient via MyChart unless otherwise noted below.    The patient was advised to call back or seek an in-person evaluation if the symptoms worsen or if the condition fails to improve as anticipated.  Time:  I spent 10 minutes with the patient via telehealth technology discussing the above problems/concerns.    [place], PA

## 2021-10-25 ENCOUNTER — Other Ambulatory Visit: Payer: Self-pay

## 2021-10-25 ENCOUNTER — Telehealth: Payer: 59 | Admitting: Physician Assistant

## 2021-10-25 DIAGNOSIS — G43009 Migraine without aura, not intractable, without status migrainosus: Secondary | ICD-10-CM

## 2021-10-25 MED ORDER — ONDANSETRON 4 MG PO TBDP
4.0000 mg | ORAL_TABLET | Freq: Three times a day (TID) | ORAL | 0 refills | Status: DC | PRN
  Filled 2021-10-25: qty 20, 7d supply, fill #0

## 2021-10-25 MED ORDER — SUMATRIPTAN SUCCINATE 50 MG PO TABS
50.0000 mg | ORAL_TABLET | Freq: Once | ORAL | 0 refills | Status: DC
  Filled 2021-10-25: qty 9, 15d supply, fill #0

## 2021-10-25 NOTE — Patient Instructions (Signed)
[name], thank you for joining Leeanne Rio, PA-C for today's virtual visit.  While this provider is not your primary care provider (PCP), if your PCP is located in our provider database this encounter information will be shared with them immediately following your visit.  Consent: (Patient) [name] provided verbal consent for this virtual visit at the beginning of the encounter.  Current Medications:  Current Outpatient Medications:    bimatoprost (LATISSE) 0.03 % ophthalmic solution, Place 1 application. into both eyes at bedtime. Place one drop on applicator and apply evenly along the skin of the upper eyelid at base of eyelashes once daily at bedtime; repeat procedure for second eye (use a clean applicator)., Disp: 5 mL, Rfl: 12   cephALEXin (KEFLEX) 500 MG capsule, Take 1 capsule (500 mg total) by mouth 4 (four) times daily for 5 days., Disp: 20 capsule, Rfl: 0   Cholecalciferol (VITAMIN D3) 2000 units capsule, Take by mouth., Disp: , Rfl:    clotrimazole-betamethasone (LOTRISONE) cream, Apply externally twice a day for 2 weeks, Disp: 45 g, Rfl: 0   hydrocortisone 2.5 % cream, Apply topically 2 (two) times daily as needed (Rash). For up to 2 weeks, Disp: 28 g, Rfl: 1   levothyroxine (SYNTHROID) 112 MCG tablet, Take 1 tablet by mouth every morning on an empty stomach with water only.  No food or other medications for 30 minutes., Disp: 90 tablet, Rfl: 1   minoxidil (ROGAINE) 2 % external solution, Apply topically 2 (two) times daily., Disp: , Rfl:    mometasone (ELOCON) 0.1 % cream, Apply 1 application. topically daily as needed (Rash)., Disp: 45 g, Rfl: 0   Multiple Vitamin (MULTI-VITAMINS) TABS, Take by mouth., Disp: , Rfl:    mupirocin ointment (BACTROBAN) 2 %, Apply 1 Application topically 3 (three) times daily., Disp: 22 g, Rfl: 0   sulfamethoxazole-trimethoprim (BACTRIM DS) 800-160 MG tablet, Take 1 tablet by mouth 2 (two) times daily for 5 days., Disp: 10  tablet, Rfl: 0   Tretinoin (ALTRENO) 0.05 % LOTN, Apply nightly as tolerated., Disp: 45 g, Rfl: 11   Medications ordered in this encounter:  No orders of the defined types were placed in this encounter.    *If you need refills on other medications prior to your next appointment, please contact your pharmacy*  Follow-Up: Call back or seek an in-person evaluation if the symptoms worsen or if the condition fails to improve as anticipated.  Other Instructions Please keep well-hydrated and get plenty of rest. Use the Zofran as directed for nausea/vomiting. A small amount of caffeine can be beneficial too. Take the Imitrex no more than as directed to abort the headache. If not fully resolving or if recurring tomorrow despite treatment, you need an in-person evaluation.  I think you can hold off on the Bactrim giving your prior infection is almost completely gone. Ok to continue just the Keflex given previously.    If you have been instructed to have an in-person evaluation today at a local Urgent Care facility, please use the link below. It will take you to a list of all of our available [facility], including address, phone number and hours of operation. Please do not delay care.  [facility]  If you or a family member do not have a primary care provider, use the link below to schedule a visit and establish care. When you choose a [facility] primary care physician or advanced practice provider, you gain a  long-term partner in health. Find a Primary Care Provider  Learn more about [facility]'s in-office and virtual care options: [facility]

## 2021-10-25 NOTE — Progress Notes (Signed)
Virtual Visit Consent   Erika Henry, you are scheduled for a virtual visit with a [facility] provider today. Just as with appointments in the office, your consent must be obtained to participate. Your consent will be active for this visit and any virtual visit you may have with one of our providers in the next 365 days. If you have a MyChart account, a copy of this consent can be sent to you electronically.  As this is a virtual visit, video technology does not allow for your provider to perform a traditional examination. This may limit your provider's ability to fully assess your condition. If your provider identifies any concerns that need to be evaluated in person or the need to arrange testing (such as labs, EKG, etc.), we will make arrangements to do so. Although advances in technology are sophisticated, we cannot ensure that it will always work on either your end or our end. If the connection with a video visit is poor, the visit may have to be switched to a telephone visit. With either a video or telephone visit, we are not always able to ensure that we have a secure connection.  By engaging in this virtual visit, you consent to the provision of healthcare and authorize for your insurance to be billed (if applicable) for the services provided during this visit. Depending on your insurance coverage, you may receive a charge related to this service.  I need to obtain your verbal consent now. Are you willing to proceed with your visit today? Erika Henry has provided verbal consent on 10/25/2021 for a virtual visit (video or telephone). Erika Henry, [place]  Date: 10/25/2021 8:13 AM  Virtual Visit via Video Note   I, Erika Henry, connected with  Erika Henry  ([id number], Sep 08, 1980) on 10/25/21 at  7:45 AM EDT by a video-enabled telemedicine application and verified that I am speaking with the correct person using two identifiers.  Location: Patient: Virtual Visit  Location Patient: Home Provider: Virtual Visit Location Provider: Home Office   I discussed the limitations of evaluation and management by telemedicine and the availability of in person appointments. The patient expressed understanding and agreed to proceed.    History of Present Illness: Erika Henry is a 41 y.o. who identifies as a female who was assigned female at birth, and is being seen today for right-sided headache starting yesterday evening after work. Some associated nausea without emesis at that time. Some fatigue and photophobia. Denies fever, chills, sinus or URI symptoms. Around 10-11 PM took 2 Advil gel caps. Eased things off and mostly resolved. When waking up this morning headache has returned and is still R-sided/temporal. One episode of emesis this morning secondary to nausea. Tried to take an Advil this morning since it helped yesterday but could not keep it down. Has had migraines before but not in some time.   Is currently on antibiotics for skin infection but has been tolerating very well with almost full resolution of that area.  HPI: HPI  Problems:  Patient Active Problem List   Diagnosis Date Noted   Elevated liver function tests 05/06/2021   Morbid obesity ([place]) 04/22/2021   Vertigo 03/26/2018   PCOS (polycystic ovarian syndrome) 02/09/2017   Psoriasis 02/09/2017   Hypothyroidism (acquired) 05/24/2016   Vitamin D deficiency disease 05/24/2016   Body mass index (BMI) of 45.0-49.9 in adult ([place]) 05/24/2016   Gastro-esophageal reflux disease without esophagitis 08/02/2015    Allergies: No Known Allergies Medications:  Current Outpatient Medications:    ondansetron (ZOFRAN-ODT) 4 MG disintegrating tablet, Take 1 tablet (4 mg total) by mouth every 8 (eight) hours as needed for nausea or vomiting., Disp: 20 tablet, Rfl: 0   SUMAtriptan (IMITREX) 50 MG tablet, Take 1 tablet (50 mg total) by mouth once for 1 dose. May repeat in 2 hours if headache persists or  recurs., Disp: 9 tablet, Rfl: 0   bimatoprost (LATISSE) 0.03 % ophthalmic solution, Place 1 application. into both eyes at bedtime. Place one drop on applicator and apply evenly along the skin of the upper eyelid at base of eyelashes once daily at bedtime; repeat procedure for second eye (use a clean applicator)., Disp: 5 mL, Rfl: 12   cephALEXin (KEFLEX) 500 MG capsule, Take 1 capsule (500 mg total) by mouth 4 (four) times daily for 5 days., Disp: 20 capsule, Rfl: 0   Cholecalciferol (VITAMIN D3) 2000 units capsule, Take by mouth., Disp: , Rfl:    clotrimazole-betamethasone (LOTRISONE) cream, Apply externally twice a day for 2 weeks, Disp: 45 g, Rfl: 0   hydrocortisone 2.5 % cream, Apply topically 2 (two) times daily as needed (Rash). For up to 2 weeks, Disp: 28 g, Rfl: 1   levothyroxine (SYNTHROID) 112 MCG tablet, Take 1 tablet by mouth every morning on an empty stomach with water only.  No food or other medications for 30 minutes., Disp: 90 tablet, Rfl: 1   minoxidil (ROGAINE) 2 % external solution, Apply topically 2 (two) times daily., Disp: , Rfl:    mometasone (ELOCON) 0.1 % cream, Apply 1 application. topically daily as needed (Rash)., Disp: 45 g, Rfl: 0   Multiple Vitamin (MULTI-VITAMINS) TABS, Take by mouth., Disp: , Rfl:    mupirocin ointment (BACTROBAN) 2 %, Apply 1 Application topically 3 (three) times daily., Disp: 22 g, Rfl: 0   sulfamethoxazole-trimethoprim (BACTRIM DS) 800-160 MG tablet, Take 1 tablet by mouth 2 (two) times daily for 5 days., Disp: 10 tablet, Rfl: 0   Tretinoin (ALTRENO) 0.05 % LOTN, Apply nightly as tolerated., Disp: 45 g, Rfl: 11  Observations/Objective: Patient is well-developed, well-nourished in no acute distress.  Resting comfortably in bed at home.  Head is normocephalic, atraumatic.  No labored breathing. Speech is clear and coherent with logical content.  Patient is alert and oriented at baseline.   Assessment and Plan: 1. Migraine without aura and  without status migrainosus, not intractable - ondansetron (ZOFRAN-ODT) 4 MG disintegrating tablet; Take 1 tablet (4 mg total) by mouth every 8 (eight) hours as needed for nausea or vomiting.  Dispense: 20 tablet; Refill: 0 - SUMAtriptan (IMITREX) 50 MG tablet; Take 1 tablet (50 mg total) by mouth once for 1 dose. May repeat in 2 hours if headache persists or recurs.  Dispense: 9 tablet; Refill: 0  Will give dose of Imitrex for abortive therapy to take once, repeating in 2 hours if needed. Increase fluids. Caffeine recommended (small amount) to help further. Zofran ODT or nausea and to prevent emesis. Will have her stop the Bactrim and continue the Keflex 500 mg due to potential over treatment and contributing to trigger for migraine. Close PCP follow-up recommended. ER precautions reviewed.   Follow Up Instructions: I discussed the assessment and treatment plan with the patient. The patient was provided an opportunity to ask questions and all were answered. The patient agreed with the plan and demonstrated an understanding of the instructions.  A copy of instructions were sent to the patient via MyChart unless otherwise noted below.  The patient was advised to call back or seek an in-person evaluation if the symptoms worsen or if the condition fails to improve as anticipated.  Time:  I spent 10 minutes with the patient via telehealth technology discussing the above problems/concerns.    Erika Rio, PA-C

## 2021-11-01 ENCOUNTER — Other Ambulatory Visit: Payer: Self-pay

## 2021-11-01 ENCOUNTER — Other Ambulatory Visit: Payer: Self-pay | Admitting: Primary Care

## 2021-11-01 DIAGNOSIS — E039 Hypothyroidism, unspecified: Secondary | ICD-10-CM

## 2021-11-01 MED ORDER — LEVOTHYROXINE SODIUM 112 MCG PO TABS
ORAL_TABLET | ORAL | 0 refills | Status: DC
  Filled 2021-11-01: qty 90, 90d supply, fill #0

## 2021-11-03 ENCOUNTER — Encounter: Payer: 59 | Admitting: Primary Care

## 2021-11-18 ENCOUNTER — Other Ambulatory Visit: Payer: Self-pay

## 2021-11-18 ENCOUNTER — Ambulatory Visit (INDEPENDENT_AMBULATORY_CARE_PROVIDER_SITE_OTHER): Payer: 59 | Admitting: Primary Care

## 2021-11-18 ENCOUNTER — Encounter: Payer: Self-pay | Admitting: Primary Care

## 2021-11-18 VITALS — BP 128/86 | HR 81 | Temp 98.6°F | Resp 16 | Ht 62.0 in | Wt 241.5 lb

## 2021-11-18 DIAGNOSIS — L409 Psoriasis, unspecified: Secondary | ICD-10-CM | POA: Diagnosis not present

## 2021-11-18 DIAGNOSIS — E039 Hypothyroidism, unspecified: Secondary | ICD-10-CM

## 2021-11-18 DIAGNOSIS — Z23 Encounter for immunization: Secondary | ICD-10-CM

## 2021-11-18 DIAGNOSIS — K219 Gastro-esophageal reflux disease without esophagitis: Secondary | ICD-10-CM | POA: Diagnosis not present

## 2021-11-18 DIAGNOSIS — E282 Polycystic ovarian syndrome: Secondary | ICD-10-CM | POA: Diagnosis not present

## 2021-11-18 DIAGNOSIS — G43709 Chronic migraine without aura, not intractable, without status migrainosus: Secondary | ICD-10-CM | POA: Diagnosis not present

## 2021-11-18 DIAGNOSIS — Z6841 Body Mass Index (BMI) 40.0 and over, adult: Secondary | ICD-10-CM | POA: Diagnosis not present

## 2021-11-18 DIAGNOSIS — Z0001 Encounter for general adult medical examination with abnormal findings: Secondary | ICD-10-CM | POA: Diagnosis not present

## 2021-11-18 DIAGNOSIS — G43909 Migraine, unspecified, not intractable, without status migrainosus: Secondary | ICD-10-CM | POA: Insufficient documentation

## 2021-11-18 LAB — HEMOGLOBIN A1C: Hgb A1c MFr Bld: 5.4 % (ref 4.6–6.5)

## 2021-11-18 LAB — COMPREHENSIVE METABOLIC PANEL
ALT: 21 U/L (ref 0–35)
AST: 18 U/L (ref 0–37)
Albumin: 4 g/dL (ref 3.5–5.2)
Alkaline Phosphatase: 79 U/L (ref 39–117)
BUN: 15 mg/dL (ref 6–23)
CO2: 26 mEq/L (ref 19–32)
Calcium: 8.8 mg/dL (ref 8.4–10.5)
Chloride: 103 mEq/L (ref 96–112)
Creatinine, Ser: 0.73 mg/dL (ref 0.40–1.20)
GFR: 101.61 mL/min (ref >60.00)
Glucose, Bld: 82 mg/dL (ref 70–99)
Potassium: 4.3 mEq/L (ref 3.5–5.1)
Sodium: 139 mEq/L (ref 135–145)
Total Bilirubin: 0.4 mg/dL (ref 0.2–1.2)
Total Protein: 6.9 g/dL (ref 6.0–8.3)

## 2021-11-18 LAB — TSH: TSH: 2.86 u[IU]/mL (ref 0.35–5.50)

## 2021-11-18 LAB — LIPID PANEL
Cholesterol: 213 mg/dL — ABNORMAL HIGH (ref 0–200)
HDL: 58.3 mg/dL (ref >39.00)
LDL Cholesterol: 139 mg/dL — ABNORMAL HIGH (ref 0–99)
NonHDL: 154.58
Total CHOL/HDL Ratio: 4
Triglycerides: 80 mg/dL (ref 0.0–149.0)
VLDL: 16 mg/dL (ref 0.0–40.0)

## 2021-11-18 MED ORDER — METFORMIN HCL ER 500 MG PO TB24
500.0000 mg | ORAL_TABLET | Freq: Every day | ORAL | 1 refills | Status: DC
  Filled 2021-11-18: qty 90, 90d supply, fill #0
  Filled 2022-04-26: qty 90, 90d supply, fill #1

## 2021-11-18 NOTE — Patient Instructions (Addendum)
Start metformin XR 500 mg once daily in the morning with food.  Try to eliminate snacking during the day.  Increase intensity of exercise.   Stop by the lab prior to leaving today. I will notify you of your results once received.   It was a pleasure to see you today!  Preventive Care 44-42 Years Old, Female Preventive care refers to lifestyle choices and visits with your health care provider that can promote health and wellness. Preventive care visits are also called wellness exams. What can I expect for my preventive care visit? Counseling Your health care provider may ask you questions about your: Medical history, including: Past medical problems. Family medical history. Pregnancy history. Current health, including: Menstrual cycle. Method of birth control. Emotional well-being. Home life and relationship well-being. Sexual activity and sexual health. Lifestyle, including: Alcohol, nicotine or tobacco, and drug use. Access to firearms. Diet, exercise, and sleep habits. Work and work Statistician. Sunscreen use. Safety issues such as seatbelt and bike helmet use. Physical exam Your health care provider will check your: Height and weight. These may be used to calculate your BMI (body mass index). BMI is a measurement that tells if you are at a healthy weight. Waist circumference. This measures the distance around your waistline. This measurement also tells if you are at a healthy weight and may help predict your risk of certain diseases, such as type 2 diabetes and high blood pressure. Heart rate and blood pressure. Body temperature. Skin for abnormal spots. What immunizations do I need?  Vaccines are usually given at various ages, according to a schedule. Your health care provider will recommend vaccines for you based on your age, medical history, and lifestyle or other factors, such as travel or where you work. What tests do I need? Screening Your health care provider may  recommend screening tests for certain conditions. This may include: Lipid and cholesterol levels. Diabetes screening. This is done by checking your blood sugar (glucose) after you have not eaten for a while (fasting). Pelvic exam and Pap test. Hepatitis B test. Hepatitis C test. HIV (human immunodeficiency virus) test. STI (sexually transmitted infection) testing, if you are at risk. Lung cancer screening. Colorectal cancer screening. Mammogram. Talk with your health care provider about when you should start having regular mammograms. This may depend on whether you have a family history of breast cancer. BRCA-related cancer screening. This may be done if you have a family history of breast, ovarian, tubal, or peritoneal cancers. Bone density scan. This is done to screen for osteoporosis. Talk with your health care provider about your test results, treatment options, and if necessary, the need for more tests. Follow these instructions at home: Eating and drinking  Eat a diet that includes fresh fruits and vegetables, whole grains, lean protein, and low-fat dairy products. Take vitamin and mineral supplements as recommended by your health care provider. Do not drink alcohol if: Your health care provider tells you not to drink. You are pregnant, may be pregnant, or are planning to become pregnant. If you drink alcohol: Limit how much you have to 0-1 drink a day. Know how much alcohol is in your drink. In the [place] one drink equals one 12 oz bottle of beer (355 mL), one 5 oz glass of wine (148 mL), or one 1 oz glass of hard liquor (44 mL). Lifestyle Brush your teeth every morning and night with fluoride toothpaste. Floss one time each day. Exercise for at least 30 minutes 5 or more days  each week. Do not use any products that contain nicotine or tobacco. These products include cigarettes, chewing tobacco, and vaping devices, such as e-cigarettes. If you need help quitting, ask your health  care provider. Do not use drugs. If you are sexually active, practice safe sex. Use a condom or other form of protection to prevent STIs. If you do not wish to become pregnant, use a form of birth control. If you plan to become pregnant, see your health care provider for a prepregnancy visit. Take aspirin only as told by your health care provider. Make sure that you understand how much to take and what form to take. Work with your health care provider to find out whether it is safe and beneficial for you to take aspirin daily. Find healthy ways to manage stress, such as: Meditation, yoga, or listening to music. Journaling. Talking to a trusted person. Spending time with friends and family. Minimize exposure to UV radiation to reduce your risk of skin cancer. Safety Always wear your seat belt while driving or riding in a vehicle. Do not drive: If you have been drinking alcohol. Do not ride with someone who has been drinking. When you are tired or distracted. While texting. If you have been using any mind-altering substances or drugs. Wear a helmet and other protective equipment during sports activities. If you have firearms in your house, make sure you follow all gun safety procedures. Seek help if you have been physically or sexually abused. What's next? Visit your health care provider once a year for an annual wellness visit. Ask your health care provider how often you should have your eyes and teeth checked. Stay up to date on all vaccines. This information is not intended to replace advice given to you by your health care provider. Make sure you discuss any questions you have with your health care provider. Document Revised: 09/08/2020 Document Reviewed: 09/08/2020 Elsevier Patient Education  [place].

## 2021-11-18 NOTE — Progress Notes (Signed)
Subjective:    Patient ID: Erika Henry, female    DOB: 1980-06-16, 41 y.o.   MRN: [id number]  HPI  Erika Henry is a very pleasant 41 y.o. female who presents today for complete physical and follow up of chronic conditions.  She would also like to discuss her obesity. Chronic for years. Has lost about 20 pounds over the years but has noticed a plateau over the last year. She endorses a healthy diet and regular exercise. She would like to try metformin given her PCOS. She experiences symptoms of feeling "puffy", abnormal uterine bleeding, history of irregular menses. She underwent transvaginal US in November 2022 which did not show ovarian cysts. She underwent transvaginal US in 2015 which did show right ovarian cyst.   Diet currently consists of:  Breakfast: Protein shake, eggs Lunch: grilled chicken, veggie, protein shake Dinner: salad, lean protein Snacks: popcorn, peanut butter, rice cakes, sugar free jello Desserts: Infrequent Beverages: Water, diet soda, coffee with sugar free creamer  Exercise: Walking, some strength training, sugar free flavored water   Immunizations: -Tetanus: 2013 -Influenza: Due this season  -Covid-19: 2 vaccines   Eye exam: Completes annually  Dental exam: Completes semi-annually   Pap Smear: Completed in October 2022 Mammogram: Completed in November 2022    BP Readings from Last 3 Encounters:  11/18/21 128/86  07/05/21 (!) 143/93  05/26/21 128/80   Wt Readings from Last 3 Encounters:  11/18/21 241 lb 8 oz (109.5 kg)  05/26/21 247 lb (112 kg)  05/06/21 245 lb (111.1 kg)       Review of Systems  Constitutional:  Negative for unexpected weight change.  HENT:  Negative for rhinorrhea.   Respiratory:  Negative for cough and shortness of breath.   Cardiovascular:  Negative for chest pain.  Gastrointestinal:  Negative for constipation and diarrhea.  Genitourinary:  Negative for difficulty urinating and menstrual problem.   Musculoskeletal:  Positive for arthralgias. Negative for myalgias.  Skin:  Negative for rash.  Allergic/Immunologic: Negative for environmental allergies.  Neurological:  Negative for dizziness and headaches.  Psychiatric/Behavioral:  The patient is not nervous/anxious.          Past Medical History:  Diagnosis Date   ASCUS with positive high risk HPV cervical 2004   neg colpo bx 2/04   GERD (gastroesophageal reflux disease)    Hypothyroid    Lichen sclerosus    Migraines    PCOS (polycystic ovarian syndrome)    Vaccine for human papilloma virus (HPV) types 6, 11, 16, and 18 administered     Social History   Socioeconomic History   Marital status: Married    Spouse name: Not on file   Number of children: Not on file   Years of education: Not on file   Highest education level: Not on file  Occupational History   Not on file  Tobacco Use   Smoking status: Never   Smokeless tobacco: Never  Vaping Use   Vaping Use: Never used  Substance and Sexual Activity   Alcohol use: Yes    Comment: ocassional   Drug use: Not Currently   Sexual activity: Yes    Birth control/protection: None, Condom  Other Topics Concern   Not on file  Social History Narrative   Married.   No children.   Works as a Technical brewer at [employer].   Enjoys exercising, shopping, bowling, traveling.   Social Determinants of Health   Financial Resource Strain: Not on file  Food  Insecurity: Not on file  Transportation Needs: Not on file  Physical Activity: Not on file  Stress: Not on file  Social Connections: Not on file  Intimate Partner Violence: Not on file    Past Surgical History:  Procedure Laterality Date   COLPOSCOPY  2004   WISDOM TOOTH EXTRACTION  2005    Family History  Problem Relation Age of Onset   Hyperlipidemia Mother    Hypertension Mother    Hyperlipidemia Father    Hypertension Father    Prostate cancer Father    Depression Brother    Depression Maternal  Grandmother    Diabetes Maternal Grandmother    Stroke Maternal Grandmother    COPD Maternal Grandfather    Diabetes Maternal Grandfather    Stroke Maternal Grandfather    Depression Paternal Grandmother    Breast cancer Paternal Grandmother 63   Arthritis Paternal Grandfather    Heart disease Paternal Grandfather    Hyperlipidemia Paternal Grandfather    Hypertension Paternal Grandfather    Kidney disease Paternal Grandfather    Lung cancer Paternal Grandfather     No Known Allergies  Current Outpatient Medications on File Prior to Visit  Medication Sig Dispense Refill   bimatoprost (LATISSE) 0.03 % ophthalmic solution Place 1 application. into both eyes at bedtime. Place one drop on applicator and apply evenly along the skin of the upper eyelid at base of eyelashes once daily at bedtime; repeat procedure for second eye (use a clean applicator). 5 mL 12   Cholecalciferol (VITAMIN D3) 2000 units capsule Take by mouth.     clotrimazole-betamethasone (LOTRISONE) cream Apply externally twice a day for 2 weeks 45 g 0   hydrocortisone 2.5 % cream Apply topically 2 (two) times daily as needed (Rash). For up to 2 weeks 28 g 1   levothyroxine (SYNTHROID) 112 MCG tablet Take 1 tablet by mouth every morning on an empty stomach with water only.  No food or other medications for 30 minutes. 90 tablet 0   minoxidil (ROGAINE) 2 % external solution Apply topically 2 (two) times daily.     mometasone (ELOCON) 0.1 % cream Apply 1 application. topically daily as needed (Rash). 45 g 0   Multiple Vitamin (MULTI-VITAMINS) TABS Take by mouth.     mupirocin ointment (BACTROBAN) 2 % Apply 1 Application topically 3 (three) times daily. 22 g 0   Tretinoin (ALTRENO) 0.05 % LOTN Apply nightly as tolerated. 45 g 11   ondansetron (ZOFRAN-ODT) 4 MG disintegrating tablet Take 1 tablet (4 mg total) by mouth every 8 (eight) hours as needed for nausea or vomiting. 20 tablet 0   SUMAtriptan (IMITREX) 50 MG tablet Take 1  tablet (50 mg total) by mouth once for 1 dose. May repeat in 2 hours if headache persists or recurs. 9 tablet 0   No current facility-administered medications on file prior to visit.    BP 128/86   Pulse 81   Temp 98.6 F (37 C)   Resp 16   Ht '5\' 2"'$  (1.575 m)   Wt 241 lb 8 oz (109.5 kg)   LMP 11/04/2021 (Approximate)   SpO2 97%   BMI 44.17 kg/m  Objective:   Physical Exam HENT:     Right Ear: Tympanic membrane and ear canal normal.     Left Ear: Tympanic membrane and ear canal normal.     Nose: Nose normal.  Eyes:     Conjunctiva/sclera: Conjunctivae normal.     Pupils: Pupils are equal, round, and  reactive to light.  Neck:     Thyroid: No thyromegaly.  Cardiovascular:     Rate and Rhythm: Normal rate and regular rhythm.     Heart sounds: No murmur heard. Pulmonary:     Effort: Pulmonary effort is normal.     Breath sounds: Normal breath sounds. No rales.  Abdominal:     General: Bowel sounds are normal.     Palpations: Abdomen is soft.     Tenderness: There is no abdominal tenderness.  Musculoskeletal:        General: Normal range of motion.     Cervical back: Neck supple.  Lymphadenopathy:     Cervical: No cervical adenopathy.  Skin:    General: Skin is warm and dry.     Findings: No rash.  Neurological:     Mental Status: She is alert and oriented to person, place, and time.     Cranial Nerves: No cranial nerve deficit.     Deep Tendon Reflexes: Reflexes are normal and symmetric.  Psychiatric:        Mood and Affect: Mood normal.           Assessment & Plan:   Problem List Items Addressed This Visit       Cardiovascular and Mediastinum   Migraines    Infrequent overall.   Continue sumatriptan 50 mg PRN. Continue Zofran ODT 4 mg PRN.        Digestive   Gastro-esophageal reflux disease without esophagitis    Controlled.  Continue PRN Tums.        Endocrine   Hypothyroidism (acquired)    She is taking levothyroxine  correctly.  Continue levothyroxine 112 mcg daily. Repeat TSH pending.      Relevant Orders   TSH   PCOS (polycystic ovarian syndrome)    Reviewed transvaginal US from November 2022 and 2015.  Agree to provide low dose metformin as she is symptomatic.  Start metformin XR 500 mg daily. Checking labs today.  Will monitor.       Relevant Medications   metFORMIN (GLUCOPHAGE-XR) 500 MG 24 hr tablet   Other Relevant Orders   Lipid panel   Hemoglobin A1c   Comprehensive metabolic panel     Musculoskeletal and Integument   Psoriasis    Following with dermatology.  No recent flares.          Other   Encounter for annual general medical examination with abnormal findings in adult - Primary    Tetanus due today, provided. She will obtain flu vaccine at work. Pap smear UTD. Follows with GYN. Mammogram UTD.  Discussed to cut back on snacking. Increase exercise.  Exam today stable Labs pending.      Class 3 obesity ([place])    Commended her on her overall healthy diet.  Discussed to eliminate snacking during the day. Increase intensity of exercise.  Agree to provide metformin XR 500 mg once daily given her history of PCOS and obesity. Discussed common potential side effects.   Checking labs today.      Relevant Orders   Lipid panel   Hemoglobin A1c   Comprehensive metabolic panel   Other Visit Diagnoses     Need for Tdap vaccination       Relevant Orders   Tdap vaccine greater than or equal to 7yo IM          Pleas Koch, NP

## 2021-11-18 NOTE — Assessment & Plan Note (Signed)
She is taking levothyroxine correctly.  Continue levothyroxine 112 mcg daily. Repeat TSH pending.

## 2021-11-18 NOTE — Assessment & Plan Note (Signed)
Controlled.  Continue PRN Tums.

## 2021-11-18 NOTE — Assessment & Plan Note (Signed)
Reviewed transvaginal US from November 2022 and 2015.  Agree to provide low dose metformin as she is symptomatic.  Start metformin XR 500 mg daily. Checking labs today.  Will monitor.

## 2021-11-18 NOTE — Assessment & Plan Note (Addendum)
Following with dermatology.  No recent flares.

## 2021-11-18 NOTE — Assessment & Plan Note (Signed)
Tetanus due today, provided. She will obtain flu vaccine at work. Pap smear UTD. Follows with GYN. Mammogram UTD.  Discussed to cut back on snacking. Increase exercise.  Exam today stable Labs pending.

## 2021-11-18 NOTE — Assessment & Plan Note (Signed)
Infrequent overall.   Continue sumatriptan 50 mg PRN. Continue Zofran ODT 4 mg PRN.

## 2021-11-18 NOTE — Assessment & Plan Note (Signed)
Commended her on her overall healthy diet.  Discussed to eliminate snacking during the day. Increase intensity of exercise.  Agree to provide metformin XR 500 mg once daily given her history of PCOS and obesity. Discussed common potential side effects.   Checking labs today.

## 2021-12-09 DIAGNOSIS — H5213 Myopia, bilateral: Secondary | ICD-10-CM | POA: Diagnosis not present

## 2022-01-04 ENCOUNTER — Other Ambulatory Visit: Payer: Self-pay

## 2022-01-04 MED ORDER — CLINDAMYCIN PHOSPHATE 1 % EX SOLN: CUTANEOUS | 2 refills | Status: DC

## 2022-01-23 ENCOUNTER — Ambulatory Visit: Payer: 59 | Admitting: Obstetrics and Gynecology

## 2022-01-24 ENCOUNTER — Ambulatory Visit: Payer: 59 | Admitting: Obstetrics and Gynecology

## 2022-01-30 ENCOUNTER — Other Ambulatory Visit: Payer: Self-pay

## 2022-01-30 ENCOUNTER — Other Ambulatory Visit: Payer: Self-pay | Admitting: Primary Care

## 2022-01-30 DIAGNOSIS — E039 Hypothyroidism, unspecified: Secondary | ICD-10-CM

## 2022-01-30 MED ORDER — LEVOTHYROXINE SODIUM 112 MCG PO TABS
ORAL_TABLET | ORAL | 2 refills | Status: DC
  Filled 2022-01-30: qty 90, 90d supply, fill #0
  Filled 2022-04-26: qty 90, 90d supply, fill #1
  Filled 2022-07-28: qty 90, 90d supply, fill #2

## 2022-02-24 ENCOUNTER — Ambulatory Visit: Payer: 59 | Admitting: Primary Care

## 2022-02-28 ENCOUNTER — Ambulatory Visit: Payer: 59 | Admitting: Obstetrics and Gynecology

## 2022-03-22 ENCOUNTER — Telehealth: Payer: 59 | Admitting: Physician Assistant

## 2022-03-22 ENCOUNTER — Other Ambulatory Visit: Payer: Self-pay

## 2022-03-22 DIAGNOSIS — R6889 Other general symptoms and signs: Secondary | ICD-10-CM | POA: Diagnosis not present

## 2022-03-22 DIAGNOSIS — R509 Fever, unspecified: Secondary | ICD-10-CM

## 2022-03-22 DIAGNOSIS — J3489 Other specified disorders of nose and nasal sinuses: Secondary | ICD-10-CM | POA: Diagnosis not present

## 2022-03-22 DIAGNOSIS — R197 Diarrhea, unspecified: Secondary | ICD-10-CM

## 2022-03-22 DIAGNOSIS — R100 Acute abdomen: Secondary | ICD-10-CM | POA: Diagnosis not present

## 2022-03-22 DIAGNOSIS — R059 Cough, unspecified: Secondary | ICD-10-CM | POA: Diagnosis not present

## 2022-03-22 DIAGNOSIS — R519 Headache, unspecified: Secondary | ICD-10-CM | POA: Diagnosis not present

## 2022-03-22 MED ORDER — OSELTAMIVIR PHOSPHATE 75 MG PO CAPS
75.0000 mg | ORAL_CAPSULE | Freq: Two times a day (BID) | ORAL | 0 refills | Status: DC
  Filled 2022-03-22: qty 10, 5d supply, fill #0

## 2022-03-22 MED ORDER — ONDANSETRON HCL 4 MG PO TABS
4.0000 mg | ORAL_TABLET | Freq: Three times a day (TID) | ORAL | 0 refills | Status: DC | PRN
  Filled 2022-03-22: qty 20, 7d supply, fill #0

## 2022-03-22 MED ORDER — BENZONATATE 100 MG PO CAPS
100.0000 mg | ORAL_CAPSULE | Freq: Three times a day (TID) | ORAL | 0 refills | Status: DC | PRN
  Filled 2022-03-22: qty 30, 10d supply, fill #0

## 2022-03-22 NOTE — Progress Notes (Signed)
Virtual Visit Consent   [name], you are scheduled for a virtual visit with a [facility] provider today. Just as with appointments in the office, your consent must be obtained to participate. Your consent will be active for this visit and any virtual visit you may have with one of our providers in the next 365 days. If you have a MyChart account, a copy of this consent can be sent to you electronically.  As this is a virtual visit, video technology does not allow for your provider to perform a traditional examination. This may limit your provider's ability to fully assess your condition. If your provider identifies any concerns that need to be evaluated in person or the need to arrange testing (such as labs, EKG, etc.), we will make arrangements to do so. Although advances in technology are sophisticated, we cannot ensure that it will always work on either your end or our end. If the connection with a video visit is poor, the visit may have to be switched to a telephone visit. With either a video or telephone visit, we are not always able to ensure that we have a secure connection.  By engaging in this virtual visit, you consent to the provision of healthcare and authorize for your insurance to be billed (if applicable) for the services provided during this visit. Depending on your insurance coverage, you may receive a charge related to this service.  I need to obtain your verbal consent now. Are you willing to proceed with your visit today? Erika Henry has provided verbal consent on 03/22/2022 for a virtual visit (video or telephone). [name], PA-C  Date: 03/22/2022 1:54 PM  Virtual Visit via Video Note   I, [name], connected with  Erika Henry  ([id number], 05/14/1980) on 03/22/22 at  1:45 PM EST by a video-enabled telemedicine application and verified that I am speaking with the correct person using two identifiers.  Location: Patient: Virtual Visit  Location Patient: Home Provider: Virtual Visit Location Provider: Home Office   I discussed the limitations of evaluation and management by telemedicine and the availability of in person appointments. The patient expressed understanding and agreed to proceed.    History of Present Illness: Erika Henry is a 41 y.o. who identifies as a female who was assigned female at birth, and is being seen today for Flu-like symptoms.  HPI: URI  This is a new problem. The current episode started yesterday. The problem has been gradually worsening. Maximum temperature: subjective fevers yesterday afternoon with hot and cold chills; did have low grade 99.1 this morning. Associated symptoms include congestion, coughing, diarrhea, headaches, nausea, rhinorrhea, sinus pain and vomiting (once). Pertinent negatives include no ear pain, plugged ear sensation or sore throat. Associated symptoms comments: Fatigue. She has tried acetaminophen and increased fluids for the symptoms. The treatment provided no relief.    Covid 19 at home testing is negative.  Parents had Influenza type A, mother hospitalized with it.  Problems:  Patient Active Problem List   Diagnosis Date Noted   Migraines 11/18/2021   Elevated liver function tests 05/06/2021   Class 3 obesity ([place]) 04/22/2021   Encounter for annual general medical examination with abnormal findings in adult 10/11/2018   Vertigo 03/26/2018   PCOS (polycystic ovarian syndrome) 02/09/2017   Psoriasis 02/09/2017   Hypothyroidism (acquired) 05/24/2016   Vitamin D deficiency disease 05/24/2016   Body mass index (BMI) of 45.0-49.9 in adult ([place]) 05/24/2016   Gastro-esophageal reflux  disease without esophagitis 08/02/2015    Allergies: No Known Allergies Medications:  Current Outpatient Medications:    benzonatate (TESSALON) 100 MG capsule, Take 1 capsule (100 mg total) by mouth 3 (three) times daily as needed., Disp: 30 capsule, Rfl: 0   ondansetron (ZOFRAN) 4  MG tablet, Take 1 tablet (4 mg total) by mouth every 8 (eight) hours as needed for nausea or vomiting., Disp: 20 tablet, Rfl: 0   oseltamivir (TAMIFLU) 75 MG capsule, Take 1 capsule (75 mg total) by mouth 2 (two) times daily., Disp: 10 capsule, Rfl: 0   bimatoprost (LATISSE) 0.03 % ophthalmic solution, Place 1 application. into both eyes at bedtime. Place one drop on applicator and apply evenly along the skin of the upper eyelid at base of eyelashes once daily at bedtime; repeat procedure for second eye (use a clean applicator)., Disp: 5 mL, Rfl: 12   Cholecalciferol (VITAMIN D3) 2000 units capsule, Take by mouth., Disp: , Rfl:    clindamycin (CLEOCIN T) 1 % external solution, Apply topically as directed. Apply once to twice daily to affected areas on neck and face as needed for flares, Disp: 30 mL, Rfl: 2   clotrimazole-betamethasone (LOTRISONE) cream, Apply externally twice a day for 2 weeks, Disp: 45 g, Rfl: 0   hydrocortisone 2.5 % cream, Apply topically 2 (two) times daily as needed (Rash). For up to 2 weeks, Disp: 28 g, Rfl: 1   levothyroxine (SYNTHROID) 112 MCG tablet, Take 1 tablet by mouth every morning on an empty stomach with water only.  No food or other medications for 30 minutes., Disp: 90 tablet, Rfl: 2   metFORMIN (GLUCOPHAGE-XR) 500 MG 24 hr tablet, Take 1 tablet (500 mg total) by mouth daily with breakfast. For PCOS, Disp: 90 tablet, Rfl: 1   minoxidil (ROGAINE) 2 % external solution, Apply topically 2 (two) times daily., Disp: , Rfl:    mometasone (ELOCON) 0.1 % cream, Apply 1 application. topically daily as needed (Rash)., Disp: 45 g, Rfl: 0   Multiple Vitamin (MULTI-VITAMINS) TABS, Take by mouth., Disp: , Rfl:    mupirocin ointment (BACTROBAN) 2 %, Apply 1 Application topically 3 (three) times daily., Disp: 22 g, Rfl: 0   SUMAtriptan (IMITREX) 50 MG tablet, Take 1 tablet (50 mg total) by mouth once for 1 dose. May repeat in 2 hours if headache persists or recurs., Disp: 9 tablet,  Rfl: 0   Tretinoin (ALTRENO) 0.05 % LOTN, Apply nightly as tolerated., Disp: 45 g, Rfl: 11  Observations/Objective: Patient is well-developed, well-nourished in no acute distress.  Resting comfortably at home.  Head is normocephalic, atraumatic.  No labored breathing.  Speech is clear and coherent with logical content.  Patient is alert and oriented at baseline.    Assessment and Plan: 1. Flu-like symptoms - oseltamivir (TAMIFLU) 75 MG capsule; Take 1 capsule (75 mg total) by mouth 2 (two) times daily.  Dispense: 10 capsule; Refill: 0 - ondansetron (ZOFRAN) 4 MG tablet; Take 1 tablet (4 mg total) by mouth every 8 (eight) hours as needed for nausea or vomiting.  Dispense: 20 tablet; Refill: 0 - benzonatate (TESSALON) 100 MG capsule; Take 1 capsule (100 mg total) by mouth 3 (three) times daily as needed.  Dispense: 30 capsule; Refill: 0  - Suspected viral URI with negative covid testing, most likely flu due to exposure and high prevalence in the community - Limited testing availability that would delay appropriate treatment waiting on results - Tamiflu prescribed for possible flu - Zofran for nausea -  Tessalon for cough - Work note provided - Push fluids - Symptomatic management OTC of choice as needed - Seek in person evaluation if symptoms worsen or fail to improve   Follow Up Instructions: I discussed the assessment and treatment plan with the patient. The patient was provided an opportunity to ask questions and all were answered. The patient agreed with the plan and demonstrated an understanding of the instructions.  A copy of instructions were sent to the patient via MyChart unless otherwise noted below.    The patient was advised to call back or seek an in-person evaluation if the symptoms worsen or if the condition fails to improve as anticipated.  Time:  I spent 10 minutes with the patient via telehealth technology discussing the above problems/concerns.    [name], PA-C

## 2022-03-22 NOTE — Patient Instructions (Signed)
[name], thank you for joining [name], PA-C for today's virtual visit.  While this provider is not your primary care provider (PCP), if your PCP is located in our provider database this encounter information will be shared with them immediately following your visit.   [facility] account gives you access to today's visit and all your visits, tests, and labs performed at [facility] " click here if you don't have a [facility] account or go to [email or web address]  Consent: (Patient) Erika Henry provided verbal consent for this virtual visit at the beginning of the encounter.  Current Medications:  Current Outpatient Medications:    benzonatate (TESSALON) 100 MG capsule, Take 1 capsule (100 mg total) by mouth 3 (three) times daily as needed., Disp: 30 capsule, Rfl: 0   ondansetron (ZOFRAN) 4 MG tablet, Take 1 tablet (4 mg total) by mouth every 8 (eight) hours as needed for nausea or vomiting., Disp: 20 tablet, Rfl: 0   oseltamivir (TAMIFLU) 75 MG capsule, Take 1 capsule (75 mg total) by mouth 2 (two) times daily., Disp: 10 capsule, Rfl: 0   bimatoprost (LATISSE) 0.03 % ophthalmic solution, Place 1 application. into both eyes at bedtime. Place one drop on applicator and apply evenly along the skin of the upper eyelid at base of eyelashes once daily at bedtime; repeat procedure for second eye (use a clean applicator)., Disp: 5 mL, Rfl: 12   Cholecalciferol (VITAMIN D3) 2000 units capsule, Take by mouth., Disp: , Rfl:    clindamycin (CLEOCIN T) 1 % external solution, Apply topically as directed. Apply once to twice daily to affected areas on neck and face as needed for flares, Disp: 30 mL, Rfl: 2   clotrimazole-betamethasone (LOTRISONE) cream, Apply externally twice a day for 2 weeks, Disp: 45 g, Rfl: 0   hydrocortisone 2.5 % cream, Apply topically 2 (two) times daily as needed (Rash). For up to 2 weeks, Disp: 28 g, Rfl: 1   levothyroxine  (SYNTHROID) 112 MCG tablet, Take 1 tablet by mouth every morning on an empty stomach with water only.  No food or other medications for 30 minutes., Disp: 90 tablet, Rfl: 2   metFORMIN (GLUCOPHAGE-XR) 500 MG 24 hr tablet, Take 1 tablet (500 mg total) by mouth daily with breakfast. For PCOS, Disp: 90 tablet, Rfl: 1   minoxidil (ROGAINE) 2 % external solution, Apply topically 2 (two) times daily., Disp: , Rfl:    mometasone (ELOCON) 0.1 % cream, Apply 1 application. topically daily as needed (Rash)., Disp: 45 g, Rfl: 0   Multiple Vitamin (MULTI-VITAMINS) TABS, Take by mouth., Disp: , Rfl:    mupirocin ointment (BACTROBAN) 2 %, Apply 1 Application topically 3 (three) times daily., Disp: 22 g, Rfl: 0   SUMAtriptan (IMITREX) 50 MG tablet, Take 1 tablet (50 mg total) by mouth once for 1 dose. May repeat in 2 hours if headache persists or recurs., Disp: 9 tablet, Rfl: 0   Tretinoin (ALTRENO) 0.05 % LOTN, Apply nightly as tolerated., Disp: 45 g, Rfl: 11   Medications ordered in this encounter:  Meds ordered this encounter  Medications   oseltamivir (TAMIFLU) 75 MG capsule    Sig: Take 1 capsule (75 mg total) by mouth 2 (two) times daily.    Dispense:  10 capsule    Refill:  0    Order Specific Question:   Supervising Provider    Answer:   Chase Picket [[id number]]   ondansetron (ZOFRAN) 4 MG tablet  Sig: Take 1 tablet (4 mg total) by mouth every 8 (eight) hours as needed for nausea or vomiting.    Dispense:  20 tablet    Refill:  0    Order Specific Question:   Supervising Provider    Answer:   Chase Picket [[id number]]   benzonatate (TESSALON) 100 MG capsule    Sig: Take 1 capsule (100 mg total) by mouth 3 (three) times daily as needed.    Dispense:  30 capsule    Refill:  0    Order Specific Question:   Supervising Provider    Answer:   Chase Picket [id number]     *If you need refills on other medications prior to your next appointment, please contact your  pharmacy*  Follow-Up: Call back or seek an in-person evaluation if the symptoms worsen or if the condition fails to improve as anticipated.  [facility] [phone]  Other Instructions  Influenza, Adult Influenza, also called "the flu," is a viral infection that mainly affects the respiratory tract. This includes the lungs, nose, and throat. The flu spreads easily from person to person (is contagious). It causes common cold symptoms, along with high fever and body aches. What are the causes? This condition is caused by the influenza virus. You can get the virus by: Breathing in droplets that are in the air from an infected person's cough or sneeze. Touching something that has the virus on it (has been contaminated) and then touching your mouth, nose, or eyes. What increases the risk? The following factors may make you more likely to get the flu: Not washing or sanitizing your hands often. Having close contact with many people during cold and flu season. Touching your mouth, eyes, or nose without first washing or sanitizing your hands. Not getting an annual flu shot. You may have a higher risk for the flu, including serious problems, such as a lung infection (pneumonia), if you: Are older than 65. Are pregnant. Have a weakened disease-fighting system (immune system). This includes people who have HIV or AIDS, are on chemotherapy, or are taking medicines that reduce (suppress) the immune system. Have a long-term (chronic) illness, such as heart disease, kidney disease, diabetes, or lung disease. Have a liver disorder. Are severely overweight (morbidly obese). Have anemia. Have asthma. What are the signs or symptoms? Symptoms of this condition usually begin suddenly and last 4-14 days. These may include: Fever and chills. Headaches, body aches, or muscle aches. Sore throat. Cough. Runny or stuffy (congested) nose. Chest discomfort. Poor appetite. Weakness or  fatigue. Dizziness. Nausea or vomiting. How is this diagnosed? This condition may be diagnosed based on: Your symptoms and medical history. A physical exam. Swabbing your nose or throat and testing the fluid for the influenza virus. How is this treated? If the flu is diagnosed early, you can be treated with antiviral medicine that is given by mouth (orally) or through an IV. This can help reduce how severe the illness is and how long it lasts. Taking care of yourself at home can help relieve symptoms. Your health care provider may recommend: Taking over-the-counter medicines. Drinking plenty of fluids. In many cases, the flu goes away on its own. If you have severe symptoms or complications, you may be treated in a hospital. Follow these instructions at home: Activity Rest as needed and get plenty of sleep. Stay home from work or school as told by your health care provider. Unless you are visiting your health care  provider, avoid leaving home until your fever has been gone for 24 hours without taking medicine. Eating and drinking Take an oral rehydration solution (ORS). This is a drink that is sold at pharmacies and retail stores. Drink enough fluid to keep your urine pale yellow. Drink clear fluids in small amounts as you are able. Clear fluids include water, ice chips, fruit juice mixed with water, and low-calorie sports drinks. Eat bland, easy-to-digest foods in small amounts as you are able. These foods include bananas, applesauce, rice, lean meats, toast, and crackers. Avoid drinking fluids that contain a lot of sugar or caffeine, such as energy drinks, regular sports drinks, and soda. Avoid alcohol. Avoid spicy or fatty foods. General instructions     Take over-the-counter and prescription medicines only as told by your health care provider. Use a cool mist humidifier to add humidity to the air in your home. This can make it easier to breathe. When using a cool mist humidifier,  clean it daily. Empty the water and replace it with clean water. Cover your mouth and nose when you cough or sneeze. Wash your hands with soap and water often and for at least 20 seconds, especially after you cough or sneeze. If soap and water are not available, use alcohol-based hand sanitizer. Keep all follow-up visits. This is important. How is this prevented?  Get an annual flu shot. This is usually available in late summer, fall, or winter. Ask your health care provider when you should get your flu shot. Avoid contact with people who are sick during cold and flu season. This is generally fall and winter. Contact a health care provider if: You develop new symptoms. You have: Chest pain. Diarrhea. A fever. Your cough gets worse. You produce more mucus. You feel nauseous or you vomit. Get help right away if you: Develop shortness of breath or have difficulty breathing. Have skin or nails that turn a bluish color. Have severe pain or stiffness in your neck. Develop a sudden headache or sudden pain in your face or ear. Cannot eat or drink without vomiting. These symptoms may represent a serious problem that is an emergency. Do not wait to see if the symptoms will go away. Get medical help right away. Call your local emergency services (911 in the [place] Do not drive yourself to the hospital. Summary Influenza, also called "the flu," is a viral infection that primarily affects your respiratory tract. Symptoms of the flu usually begin suddenly and last 4-14 days. Getting an annual flu shot is the best way to prevent getting the flu. Stay home from work or school as told by your health care provider. Unless you are visiting your health care provider, avoid leaving home until your fever has been gone for 24 hours without taking medicine. Keep all follow-up visits. This is important. This information is not intended to replace advice given to you by your health care provider. Make sure you  discuss any questions you have with your health care provider. Document Revised: 10/31/2019 Document Reviewed: 10/31/2019 Elsevier Patient Education  [place].    If you have been instructed to have an in-person evaluation today at a local Urgent Care facility, please use the link below. It will take you to a list of all of our available [facility], including address, phone number and hours of operation. Please do not delay care.   Urgent Cares  If you or a family member do not have a primary care provider,  use the link below to schedule a visit and establish care. When you choose a [facility] primary care physician or advanced practice provider, you gain a long-term partner in health. Find a Primary Care Provider  Learn more about 's in-office and virtual care options: [facility]

## 2022-05-03 ENCOUNTER — Other Ambulatory Visit: Payer: Self-pay

## 2022-05-03 ENCOUNTER — Other Ambulatory Visit: Payer: Self-pay | Admitting: Dermatology

## 2022-05-03 ENCOUNTER — Ambulatory Visit (INDEPENDENT_AMBULATORY_CARE_PROVIDER_SITE_OTHER): Payer: Commercial Managed Care - PPO | Admitting: Dermatology

## 2022-05-03 VITALS — BP 147/95 | HR 99

## 2022-05-03 DIAGNOSIS — L988 Other specified disorders of the skin and subcutaneous tissue: Secondary | ICD-10-CM

## 2022-05-03 DIAGNOSIS — L409 Psoriasis, unspecified: Secondary | ICD-10-CM

## 2022-05-03 DIAGNOSIS — L02219 Cutaneous abscess of trunk, unspecified: Secondary | ICD-10-CM | POA: Diagnosis not present

## 2022-05-03 DIAGNOSIS — L0291 Cutaneous abscess, unspecified: Secondary | ICD-10-CM

## 2022-05-03 MED ORDER — MUPIROCIN 2 % EX OINT
1.0000 | TOPICAL_OINTMENT | Freq: Every day | CUTANEOUS | 4 refills | Status: DC
  Filled 2022-05-03: qty 22, 22d supply, fill #0

## 2022-05-03 MED ORDER — HYDROCORTISONE 2.5 % EX CREA
1.0000 | TOPICAL_CREAM | Freq: Two times a day (BID) | CUTANEOUS | 2 refills | Status: DC
  Filled 2022-05-03: qty 30, 14d supply, fill #0

## 2022-05-03 MED ORDER — KETOCONAZOLE 2 % EX CREA
1.0000 | TOPICAL_CREAM | Freq: Two times a day (BID) | CUTANEOUS | 3 refills | Status: AC
  Filled 2022-05-03: qty 60, 14d supply, fill #0

## 2022-05-03 MED ORDER — ALTRENO 0.05 % EX LOTN: 1.0000 | TOPICAL_LOTION | Freq: Every day | CUTANEOUS | 11 refills | Status: AC

## 2022-05-03 NOTE — Patient Instructions (Addendum)
For Intertrigo (Rash at folds) - Start Ketoconazole 2% cream twice a day to affected areas when you have the rash - Start Hydrocortisone 2.5% cream/lotion twice a day to affected areas for up to 2 weeks when you have the rash. This can thin and lighten the area and cause stretch marks if it is used too often, so only use it for short periods when you have the rash. - Recommend antifungal powder such as Zeasorb AF daily for prevention    Start Mupirocin oint qd to wound left super pubic until healed.  Due to recent changes in healthcare laws, you may see results of your pathology and/or laboratory studies on MyChart before the doctors have had a chance to review them. We understand that in some cases there may be results that are confusing or concerning to you. Please understand that not all results are received at the same time and often the doctors may need to interpret multiple results in order to provide you with the best plan of care or course of treatment. Therefore, we ask that you please give [place] 2 business days to thoroughly review all your results before contacting the office for clarification. Should we see a critical lab result, you will be contacted sooner.   If You Need Anything After Your Visit  If you have any questions or concerns for your doctor, please call our main line at [phone] and press option 4 to reach your doctor's medical assistant. If no one answers, please leave a voicemail as directed and we will return your call as soon as possible. Messages left after 4 pm will be answered the following business day.   You may also send [place] a message via [place]. We typically respond to MyChart messages within 1-2 business days.  For prescription refills, please ask your pharmacy to contact our office. Our fax number is [phone].  If you have an urgent issue when the clinic is closed that cannot wait until the next business day, you can page your doctor at the number below.     Please note that while we do our best to be available for urgent issues outside of office hours, we are not available 24/7.   If you have an urgent issue and are unable to reach [place], you may choose to seek medical care at your doctor's office, retail clinic, urgent care center, or emergency room.  If you have a medical emergency, please immediately call 911 or go to the emergency department.  Pager Numbers  - Dr. Nehemiah Massed: [phone]  - Dr. Laurence Ferrari: [phone]  - Dr. Nicole Kindred: [phone]  In the event of inclement weather, please call our main line at [phone] for an update on the status of any delays or closures.  Dermatology Medication Tips: Please keep the boxes that topical medications come in in order to help keep track of the instructions about where and how to use these. Pharmacies typically print the medication instructions only on the boxes and not directly on the medication tubes.   If your medication is too expensive, please contact our office at [phone] option 4 or send [place] a message through [place].   We are unable to tell what your co-pay for medications will be in advance as this is different depending on your insurance coverage. However, we may be able to find a substitute medication at lower cost or fill out paperwork to get insurance to cover a needed medication.   If a prior authorization is required to get  your medication covered by your insurance company, please allow [place] 1-2 business days to complete this process.  Drug prices often vary depending on where the prescription is filled and some pharmacies may offer cheaper prices.  The website www.goodrx.com contains coupons for medications through different pharmacies. The prices here do not account for what the cost may be with help from insurance (it may be cheaper with your insurance), but the website can give you the price if you did not use any insurance.  - You can print the associated coupon and take  it with your prescription to the pharmacy.  - You may also stop by our office during regular business hours and pick up a GoodRx coupon card.  - If you need your prescription sent electronically to a different pharmacy, notify our office through [facility] or by phone at [phone] option 4.     Si Usted Necesita Algo Despus de Su Visita  Tambin puede enviarnos un mensaje a travs de Pharmacist, community. Por lo general respondemos a los mensajes de MyChart en el transcurso de 1 a 2 das hbiles.  Para renovar recetas, por favor pida a su farmacia que se ponga en contacto con nuestra oficina. Harland Dingwall de fax es [place] [phone].  Si tiene un asunto urgente cuando la clnica est cerrada y que no puede esperar hasta el siguiente da hbil, puede llamar/localizar a su doctor(a) al nmero que aparece a continuacin.   Por favor, tenga en cuenta que aunque hacemos todo lo posible para estar disponibles para asuntos urgentes fuera del horario de [place], no estamos disponibles las 24 horas del da, los 7 das de la [place].   Si tiene un problema urgente y no puede comunicarse con nosotros, puede optar por buscar atencin mdica  en el consultorio de su doctor(a), en una clnica privada, en un centro de atencin urgente o en una sala de emergencias.  Si tiene Engineering geologist, por favor llame inmediatamente al 911 o vaya a la sala de emergencias.  Nmeros de bper  - Dr. Nehemiah Massed: [phone]  - Dra. Moye: [phone]  - Dra. Nicole Kindred: [phone]  En caso de inclemencias del [place], por favor llame a Johnsie Kindred principal al [phone] para una actualizacin sobre el [place] de cualquier retraso o cierre.  Consejos para la medicacin en dermatologa: Por favor, guarde las cajas en las que vienen los medicamentos de uso tpico para ayudarle a seguir las instrucciones sobre dnde y cmo usarlos. Las farmacias generalmente imprimen las instrucciones del medicamento slo en las  cajas y no directamente en los tubos del [place].   Si su medicamento es muy caro, por favor, pngase en contacto con Zigmund Daniel llamando al [phone] y presione la opcin 4 o envenos un mensaje a travs de Pharmacist, community.   No podemos decirle cul ser su copago por los medicamentos por adelantado ya que esto es diferente dependiendo de la cobertura de su seguro. Sin embargo, es posible que podamos encontrar un medicamento sustituto a Electrical engineer un formulario para que el seguro cubra el medicamento que se considera necesario.   Si se requiere una autorizacin previa para que su compaa de seguros [place] su medicamento, por favor permtanos de 1 a 2 das hbiles para completar este proceso.  Los precios de los medicamentos varan con frecuencia dependiendo del Environmental consultant de dnde se surte la receta y alguna farmacias pueden ofrecer precios ms baratos.  El sitio web www.goodrx.com tiene cupones para medicamentos de Airline pilot. Los  precios aqu no tienen en cuenta lo que podra costar con la ayuda del seguro (puede ser ms barato con su seguro), pero el sitio web puede darle el precio si no utiliz Research scientist (physical sciences).  - Puede imprimir el cupn correspondiente y llevarlo con su receta a la farmacia.  - Tambin puede pasar por nuestra oficina durante el horario de atencin regular y Charity fundraiser una tarjeta de cupones de GoodRx.  - Si necesita que su receta se enve electrnicamente a una farmacia diferente, informe a nuestra oficina a travs de MyChart de [place] o por telfono llamando al [phone] y presione la opcin 4.

## 2022-05-03 NOTE — Progress Notes (Unsigned)
Follow-Up Visit   Subjective  Erika Henry is a 42 y.o. female who presents for the following: growth (Groin, 69m hx of draining, no itching no pain), Rash (Inguinal crease, itchy, pt does have some joint pain and stiffness, legs, feet, R knee feels stiff in the morning), and Facial Elastosis (Face, Altreno lotion qhs).   The following portions of the chart were reviewed this encounter and updated as appropriate:       Review of Systems:  No other skin or systemic complaints except as noted in HPI or Assessment and Plan.  Objective  Well appearing patient in no apparent distress; mood and affect are within normal limits.  A focused examination was performed including groin. Relevant physical exam findings are noted in the Assessment and Plan.  L super pubic 0.9cm sub q draining nodule  face Rhytides and volume loss.   inguinal crease Scaly pink plaques    Assessment & Plan  Abscess L super pubic  Vs Mild Hidradenitis Suppurativa  I&D today Aerobic Culture today Start Mupirocin oint qd until heale    Incision and Drainage - L super pubic Location: L super pubic  Informed Consent: Discussed risks (permanent scarring, light or dark discoloration, infection, pain, bleeding, bruising, redness, damage to adjacent structures, and recurrence of the lesion) and benefits of the procedure, as well as the alternatives.  Informed consent was obtained.  Preparation: The area was prepped with alcohol.  Anesthesia: Lidocaine 1% without epinephrine  Procedure Details: An incision was made overlying the lesion. The lesion drained blood and purulent serous.  A small amount of fluid was drained.    Antibiotic ointment and a sterile pressure dressing were applied. The patient tolerated procedure well.  Total number of lesions drained: 1  Plan: The patient was instructed on post-op care. Recommend OTC analgesia as needed for pain.  mupirocin ointment (BACTROBAN) 2 % - L  super pubic Apply 1 Application topically daily. Qd to wound  Elastosis of skin face  Cont Altreno lotion qhs  Topical retinoid medications like tretinoin/Retin-A, adapalene/Differin, tazarotene/Fabior, and Epiduo/Epiduo Forte can cause dryness and irritation when first started. Only apply a pea-sized amount to the entire affected area. Avoid applying it around the eyes, edges of mouth and creases at the nose. If you experience irritation, use a good moisturizer first and/or apply the medicine less often. If you are doing well with the medicine, you can increase how often you use it until you are applying every night. Be careful with sun protection while using this medication as it can make you sensitive to the sun. This medicine should not be used by pregnant women.    Tretinoin (ALTRENO) 0.05 % LOTN - face Apply 1 Application topically at bedtime. Qhs to face  Psoriasis inguinal crease  Vs Intertrigo  Counseling on psoriasis and coordination of care  psoriasis is a chronic non-curable, but treatable genetic/hereditary disease that may have other systemic features affecting other organ systems such as joints (Psoriatic Arthritis). It is associated with an increased risk of inflammatory bowel disease, heart disease, non-alcoholic fatty liver disease, and depression.  Treatments include light and laser treatments; topical medications; and systemic medications including oral and injectables.   Intertrigo is a chronic recurrent rash that occurs in skin fold areas that may be associated with friction; heat; moisture; yeast; fungus; and bacteria.  It is exacerbated by increased movement / activity; sweating; and higher atmospheric temperature.  For Intertrigo (Rash at folds) - Start Ketoconazole 2% cream twice  a day to affected areas when you have the rash - Start Hydrocortisone 2.5% cream/lotion twice a day to affected areas for up to 2 weeks when you have the rash. This can thin and lighten the  area and cause stretch marks if it is used too often, so only use it for short periods when you have the rash. - Recommend antifungal powder such as Zeasorb AF daily for prevention   May consider Elidel in future  Topical steroids (such as triamcinolone, fluocinolone, fluocinonide, mometasone, clobetasol, halobetasol, betamethasone, hydrocortisone) can cause thinning and lightening of the skin if they are used for too long in the same area. Your physician has selected the right strength medicine for your problem and area affected on the body. Please use your medication only as directed by your physician to prevent side effects.   Will refer pt to Rheumatology for the joint pain  ketoconazole (NIZORAL) 2 % cream - inguinal crease Apply 1 Application topically as directed. Bid to aa rash prn flares  hydrocortisone 2.5 % cream - inguinal crease Apply topically 2 (two) times daily. Bid for up to 2 weeks prn rash in groin  Related Procedures Aerobic culture  Related Medications Tretinoin (ALTRENO) 0.05 % LOTN Apply 1 Application topically at bedtime. Qhs to face   Return if symptoms worsen or fail to improve.  I, Othelia Pulling, RMA, am acting as scribe for Forest Gleason, MD .

## 2022-05-04 ENCOUNTER — Other Ambulatory Visit: Payer: Self-pay

## 2022-05-04 ENCOUNTER — Encounter: Payer: Self-pay | Admitting: Dermatology

## 2022-05-04 DIAGNOSIS — L409 Psoriasis, unspecified: Secondary | ICD-10-CM

## 2022-05-08 LAB — AEROBIC CULTURE

## 2022-06-15 ENCOUNTER — Other Ambulatory Visit: Payer: Self-pay

## 2022-06-15 NOTE — Progress Notes (Signed)
ERROR

## 2022-06-19 ENCOUNTER — Telehealth: Payer: Self-pay

## 2022-06-19 NOTE — Telephone Encounter (Signed)
Referral closed today, pt report she will call at a later date to schedule her referral at [facility] clinic

## 2022-06-28 DIAGNOSIS — E039 Hypothyroidism, unspecified: Secondary | ICD-10-CM

## 2022-06-30 ENCOUNTER — Other Ambulatory Visit (INDEPENDENT_AMBULATORY_CARE_PROVIDER_SITE_OTHER): Payer: Commercial Managed Care - PPO

## 2022-06-30 DIAGNOSIS — E039 Hypothyroidism, unspecified: Secondary | ICD-10-CM

## 2022-06-30 LAB — T4, FREE: Free T4: 1.21 ng/dL (ref 0.60–1.60)

## 2022-06-30 LAB — TSH: TSH: 1.97 u[IU]/mL (ref 0.35–5.50)

## 2022-07-24 ENCOUNTER — Other Ambulatory Visit: Payer: Self-pay

## 2022-07-24 MED ORDER — DOXYCYCLINE MONOHYDRATE 100 MG PO CAPS
100.0000 mg | ORAL_CAPSULE | Freq: Two times a day (BID) | ORAL | 0 refills | Status: DC
  Filled 2022-07-24: qty 28, 14d supply, fill #0

## 2022-07-28 ENCOUNTER — Other Ambulatory Visit: Payer: Self-pay

## 2022-08-04 ENCOUNTER — Encounter: Payer: Self-pay | Admitting: Primary Care

## 2022-08-04 ENCOUNTER — Ambulatory Visit: Payer: Commercial Managed Care - PPO | Admitting: Primary Care

## 2022-08-04 DIAGNOSIS — Z6841 Body Mass Index (BMI) 40.0 and over, adult: Secondary | ICD-10-CM | POA: Diagnosis not present

## 2022-08-04 NOTE — Assessment & Plan Note (Signed)
Following with [employer]. Discussed common side effects, importance of following administration instructions.   Also discussed her diet, discussed to avoid take out food, sweets. Increase exercise.   Follow up in late August for CPE and labs. Reviewed TSH from April 2024.

## 2022-08-04 NOTE — Progress Notes (Signed)
Subjective:    Patient ID: Erika Henry, female    DOB: [birth year], 42 y.o.   MRN: [id number]  HPI  Erika Henry is a very pleasant 42 y.o. female with a history of migraines, PCOS, hypothyroidism who presents today to discuss PCOS.  She underwent pelvic/transvaginal US in November 2022 which revealed normal ovaries, endometrium with cystic components to the endocervical canal. Her last A1C was in August 2023 with reading of 5.4. her last TSH was 1.97 in April 2024.   Diagnosed with PCOS years ago and has been treated with metformin, birth control pills previously. Birth control pills caused hypertension and metformin caused headaches and diarrhea.   She's struggled with her weight for years. She is interested in compounded Ozempic and has consulted with an [facility] physician who has agreed to provide her with a prescription. She has been notified of side effects.   Diet currently consists of:  Breakfast: Protein shake, bacon and egg sandwich Lunch: Take out food from drug reps Dinner: Meat, veggies, starch Snacks: Crackers Desserts: Daily  Beverages: Water, herbal life tea  Exercise: None   BP Readings from Last 3 Encounters:  08/04/22 136/84  05/03/22 (!) 147/95  11/18/21 128/86   Wt Readings from Last 3 Encounters:  08/04/22 256 lb (116.1 kg)  11/18/21 241 lb 8 oz (109.5 kg)  05/26/21 247 lb (112 kg)   Body mass index is 46.82 kg/m.    Review of Systems  Respiratory:  Negative for shortness of breath.   Cardiovascular:  Negative for chest pain.  Gastrointestinal:  Negative for constipation.         Past Medical History:  Diagnosis Date   ASCUS with positive high risk HPV cervical 2004   neg colpo bx 2/04   GERD (gastroesophageal reflux disease)    Hypothyroid    Lichen sclerosus    Migraines    PCOS (polycystic ovarian syndrome)    Vaccine for human papilloma virus (HPV) types 6, 11, 16, and 18 administered     Social History   Socioeconomic  History   Marital status: Married    Spouse name: Not on file   Number of children: Not on file   Years of education: Not on file   Highest education level: Not on file  Occupational History   Not on file  Tobacco Use   Smoking status: Never   Smokeless tobacco: Never  Vaping Use   Vaping Use: Never used  Substance and Sexual Activity   Alcohol use: Yes    Comment: ocassional   Drug use: Not Currently   Sexual activity: Yes    Birth control/protection: None, Condom  Other Topics Concern   Not on file  Social History Narrative   Married.   No children.   Works as a Clinical biochemist at [employer].   Enjoys exercising, shopping, bowling, traveling.   Social Determinants of Health   Financial Resource Strain: Not on file  Food Insecurity: Not on file  Transportation Needs: Not on file  Physical Activity: Not on file  Stress: Not on file  Social Connections: Not on file  Intimate Partner Violence: Not on file    Past Surgical History:  Procedure Laterality Date   COLPOSCOPY  2004   WISDOM TOOTH EXTRACTION  2005    Family History  Problem Relation Age of Onset   Hyperlipidemia Mother    Hypertension Mother    Hyperlipidemia Father    Hypertension Father    Prostate cancer  Father    Depression Brother    Depression Maternal Grandmother    Diabetes Maternal Grandmother    Stroke Maternal Grandmother    COPD Maternal Grandfather    Diabetes Maternal Grandfather    Stroke Maternal Grandfather    Depression Paternal Grandmother    Breast cancer Paternal Grandmother 51   Arthritis Paternal Grandfather    Heart disease Paternal Grandfather    Hyperlipidemia Paternal Grandfather    Hypertension Paternal Grandfather    Kidney disease Paternal Grandfather    Lung cancer Paternal Grandfather     No Known Allergies  Current Outpatient Medications on File Prior to Visit  Medication Sig Dispense Refill   bimatoprost (LATISSE) 0.03 % ophthalmic solution Place 1  application. into both eyes at bedtime. Place one drop on applicator and apply evenly along the skin of the upper eyelid at base of eyelashes once daily at bedtime; repeat procedure for second eye (use a clean applicator). 5 mL 12   Cholecalciferol (VITAMIN D3) 2000 units capsule Take by mouth.     clindamycin (CLEOCIN T) 1 % external solution Apply topically as directed. Apply once to twice daily to affected areas on neck and face as needed for flares 30 mL 2   hydrocortisone 2.5 % cream Apply 1 Application topically 2 (two) times daily for up to 2 weeks as needed for rash in groin 30 g 2   levothyroxine (SYNTHROID) 112 MCG tablet Take 1 tablet by mouth every morning on an empty stomach with water only.  No food or other medications for 30 minutes. 90 tablet 2   Multiple Vitamin (MULTI-VITAMINS) TABS Take by mouth.     Tretinoin (ALTRENO) 0.05 % LOTN Apply 1 Application topically at bedtime. Qhs to face 45 g 11   benzonatate (TESSALON) 100 MG capsule Take 1 capsule (100 mg total) by mouth 3 (three) times daily as needed. 30 capsule 0   clotrimazole-betamethasone (LOTRISONE) cream Apply externally twice a day for 2 weeks (Patient not taking: Reported on 08/04/2022) 45 g 0   doxycycline (MONODOX) 100 MG capsule Take 1 capsule (100 mg total) by mouth 2 (two) times daily with food and full glass of water. (Patient not taking: Reported on 08/04/2022) 28 capsule 0   metFORMIN (GLUCOPHAGE-XR) 500 MG 24 hr tablet Take 1 tablet (500 mg total) by mouth daily with breakfast. For PCOS 90 tablet 1   minoxidil (ROGAINE) 2 % external solution Apply topically 2 (two) times daily.     mometasone (ELOCON) 0.1 % cream Apply 1 application. topically daily as needed (Rash). (Patient not taking: Reported on 08/04/2022) 45 g 0   mupirocin ointment (BACTROBAN) 2 % Apply 1 Application topically daily to wound (Patient not taking: Reported on 08/04/2022) 22 g 4   ondansetron (ZOFRAN) 4 MG tablet Take 1 tablet (4 mg total) by mouth  every 8 (eight) hours as needed for nausea or vomiting. (Patient not taking: Reported on 08/04/2022) 20 tablet 0   oseltamivir (TAMIFLU) 75 MG capsule Take 1 capsule (75 mg total) by mouth 2 (two) times daily. 10 capsule 0   SUMAtriptan (IMITREX) 50 MG tablet Take 1 tablet (50 mg total) by mouth once for 1 dose. May repeat in 2 hours if headache persists or recurs. 9 tablet 0   No current facility-administered medications on file prior to visit.    BP 136/84   Pulse 65   Temp 97.9 F (36.6 C) (Temporal)   Ht 5\' 2"  (1.575 m)   Wt 256  lb (116.1 kg)   LMP 07/21/2022   SpO2 97%   BMI 46.82 kg/m  Objective:   Physical Exam Cardiovascular:     Rate and Rhythm: Normal rate and regular rhythm.  Pulmonary:     Effort: Pulmonary effort is normal.     Breath sounds: Normal breath sounds.  Musculoskeletal:     Cervical back: Neck supple.  Skin:    General: Skin is warm and dry.           Assessment & Plan:  Class 3 severe obesity due to excess calories without serious comorbidity with body mass index (BMI) of 45.0 to 49.9 in adult [facility]) Assessment & Plan: Following with [employer]. Discussed common side effects, importance of following administration instructions.   Also discussed her diet, discussed to avoid take out food, sweets. Increase exercise.   Follow up in late August for CPE and labs. Reviewed TSH from April 2024.         Doreene Nest, NP

## 2022-08-04 NOTE — Patient Instructions (Signed)
Stop eating take out food.  Stop eating sweets.  Start exercising. You should be getting 150 minutes of moderate intensity exercise weekly.  Ensure you are consuming 64 ounces of water daily.  It was a pleasure to see you today!

## 2022-08-08 IMAGING — DX DG KNEE COMPLETE 4+V*L*
4 series · 4 of 4 positions shown · non-contrast
Comparison: None.

CLINICAL DATA: Acute knee instability

EXAM:
LEFT KNEE - COMPLETE 4+ VIEW

[knee ap (1 of 2)]
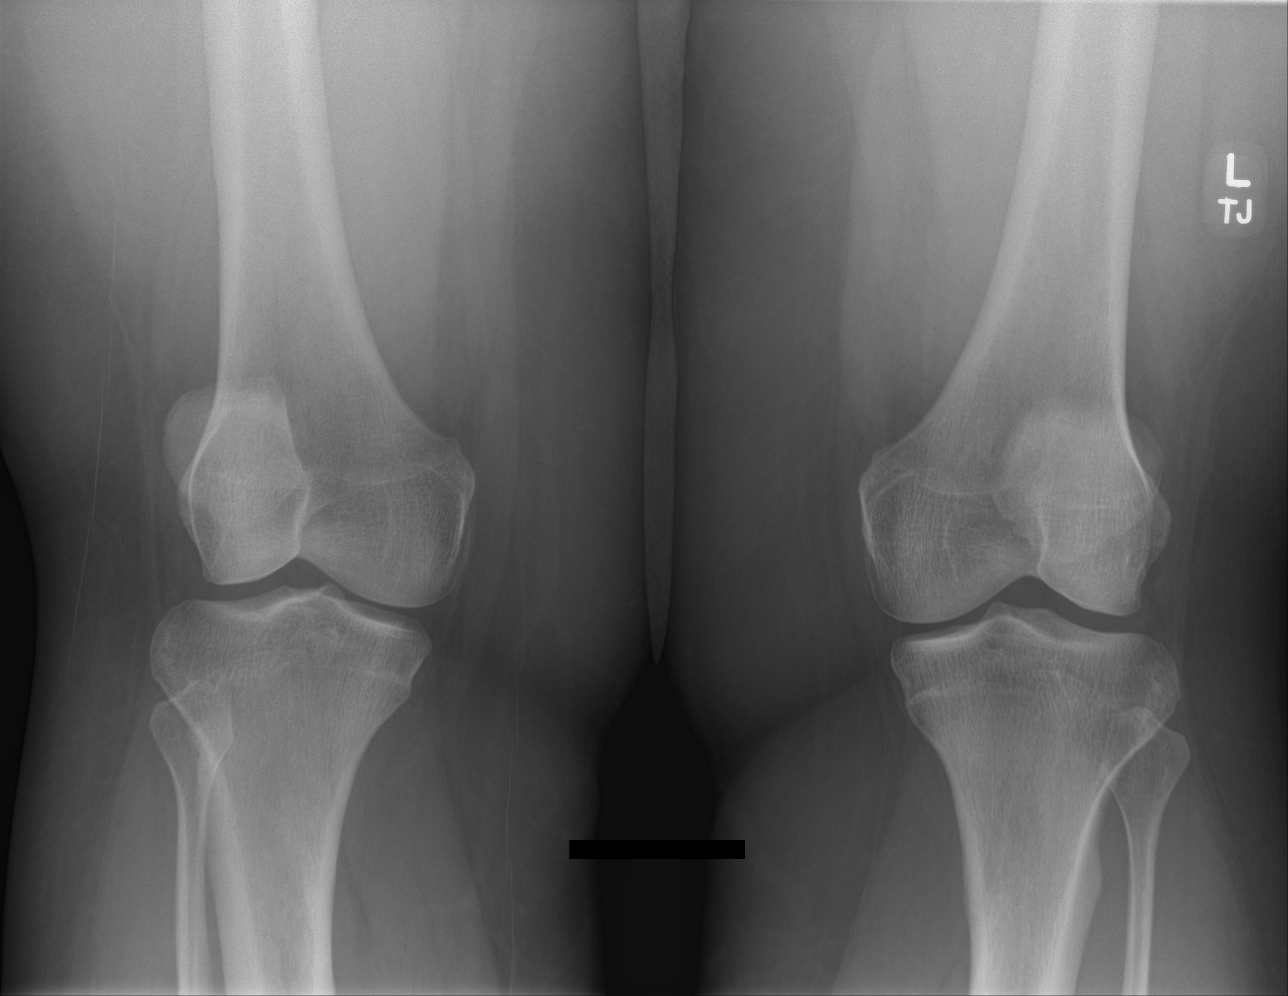

[knee lat]
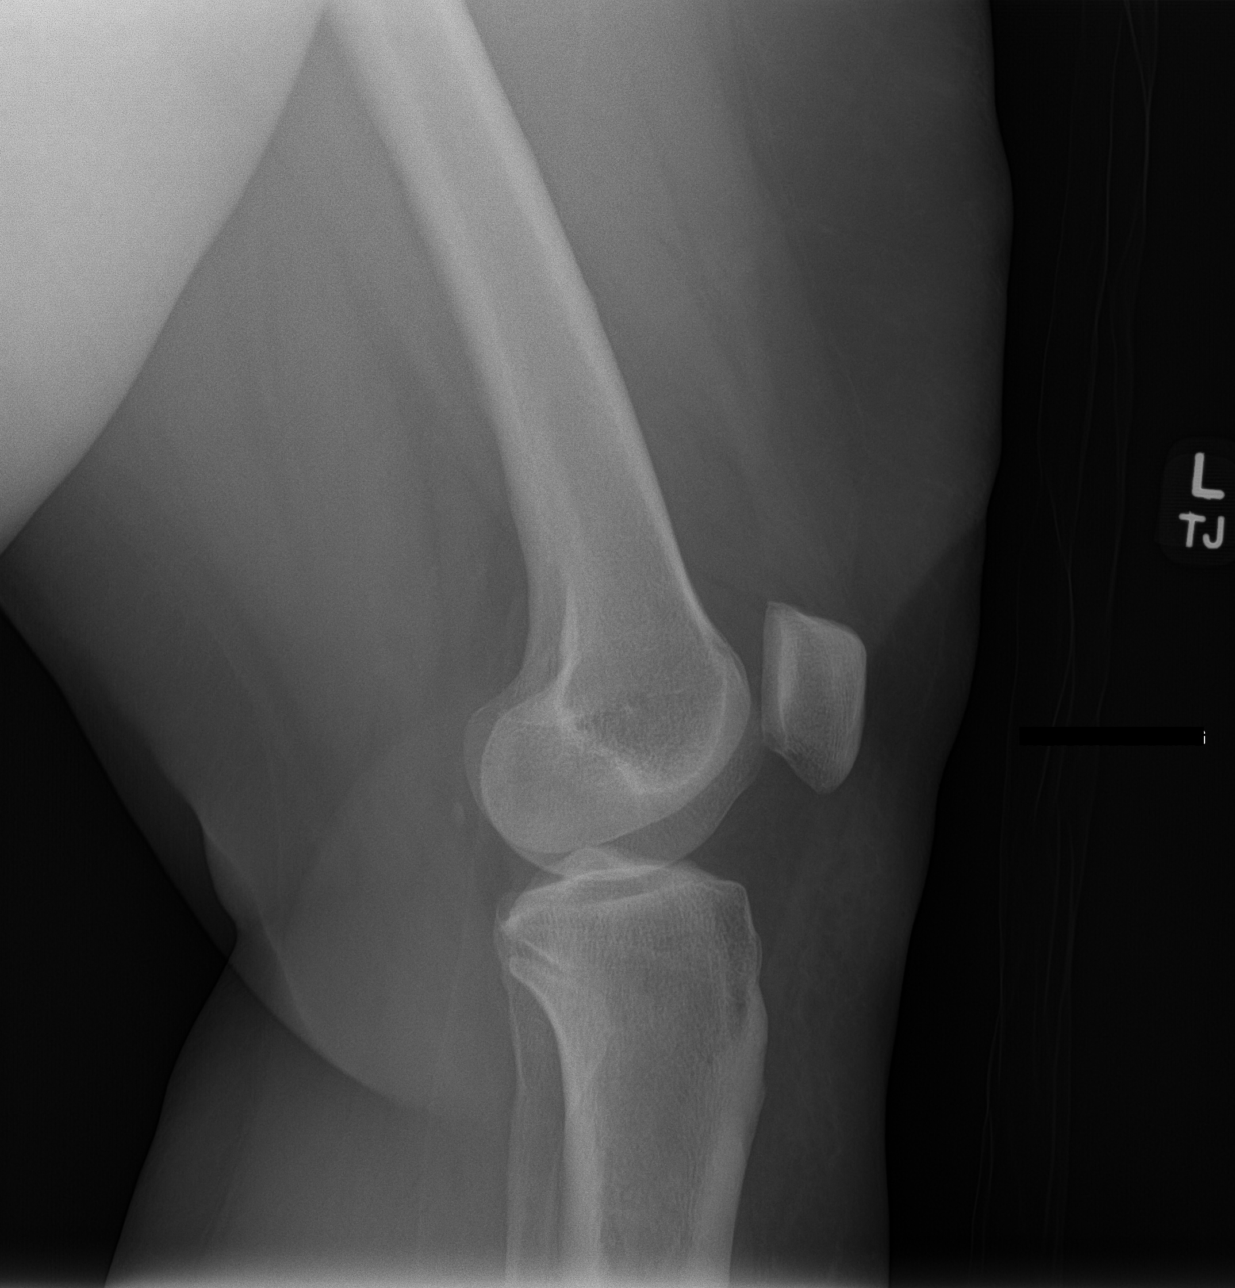

[patella skyline]
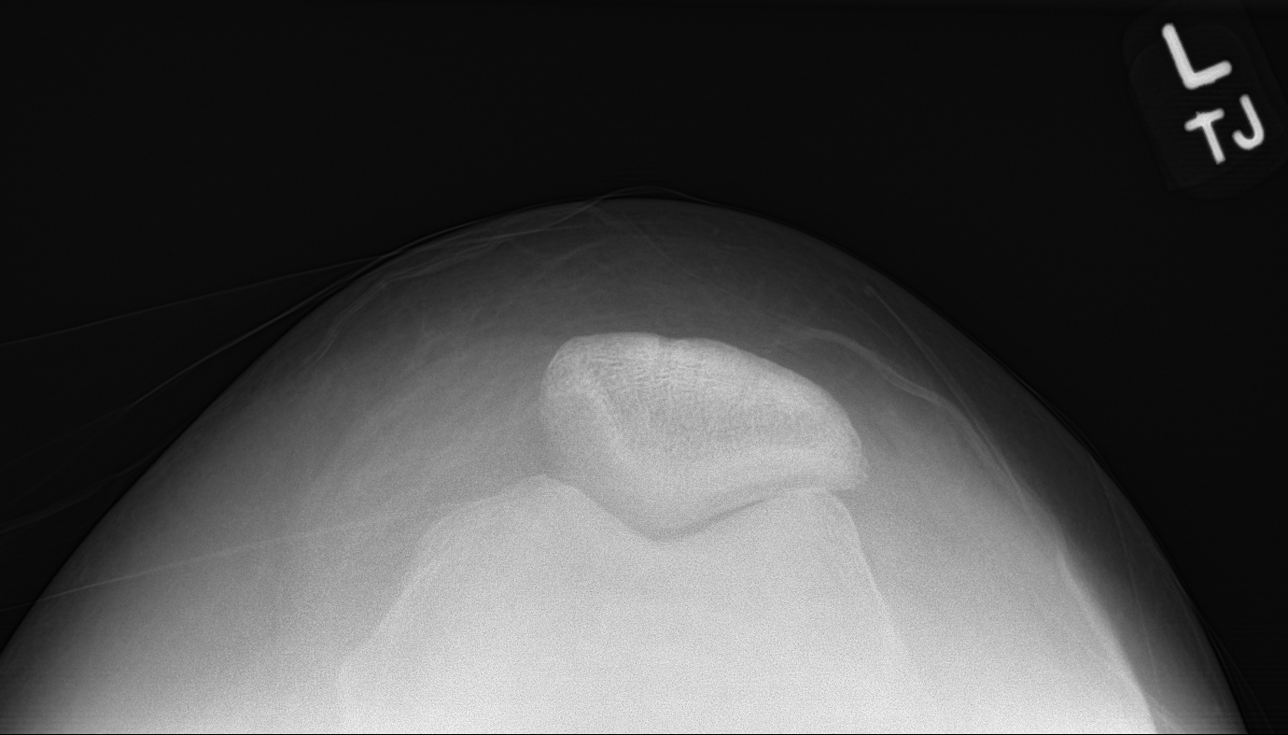

[knee ap (2 of 2)]
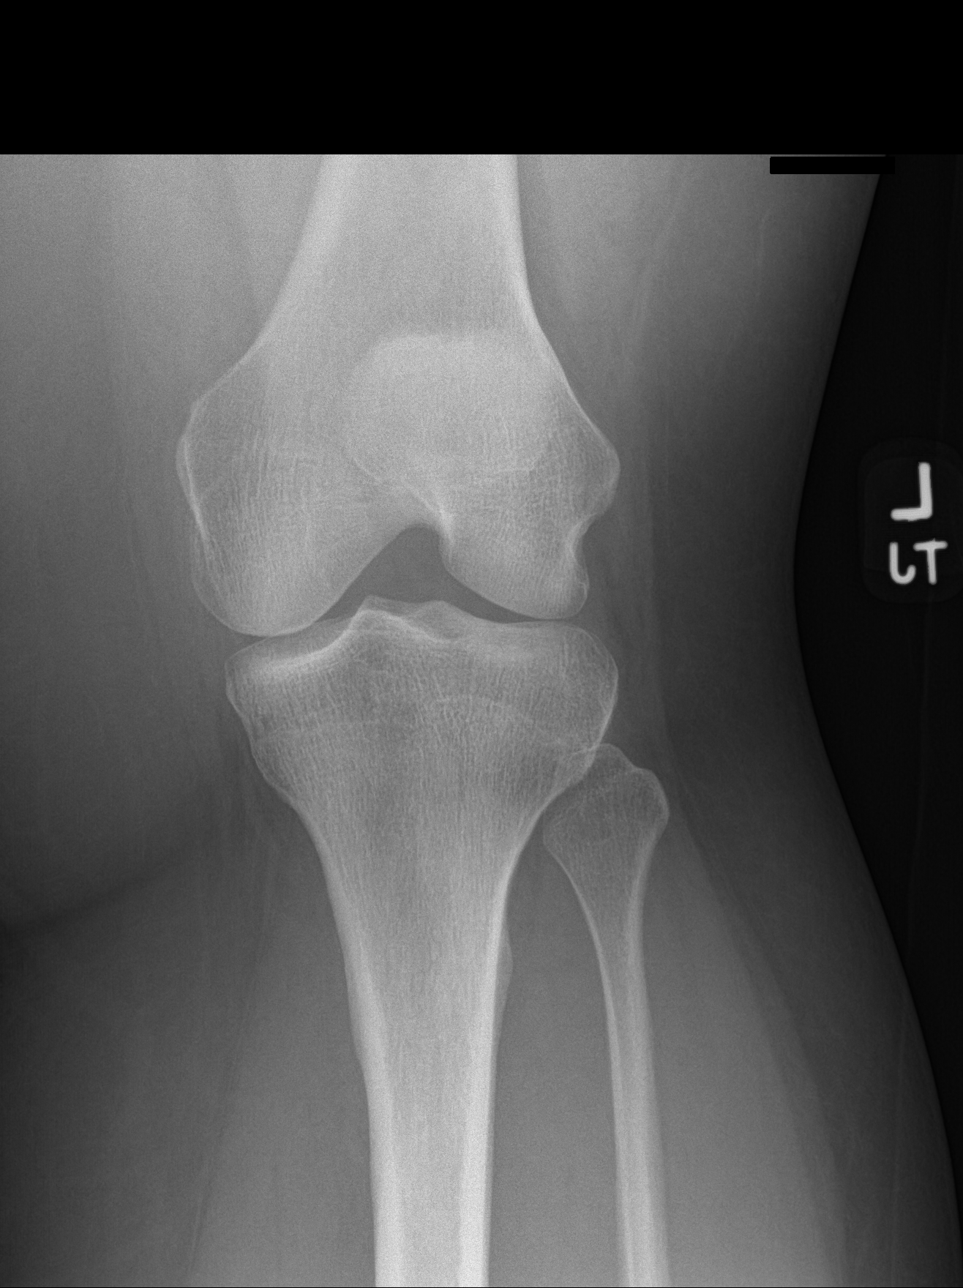

[4 of 4 positions shown; findings below may reference images not displayed]

FINDINGS: No evidence of fracture, dislocation, or joint effusion. No evidence
of arthropathy or other focal bone abnormality. Soft tissues are
unremarkable.
IMPRESSION: Negative.

## 2022-09-04 ENCOUNTER — Ambulatory Visit: Payer: Commercial Managed Care - PPO | Admitting: Dermatology

## 2022-09-04 ENCOUNTER — Other Ambulatory Visit: Payer: Self-pay

## 2022-09-04 VITALS — BP 137/84 | HR 92

## 2022-09-04 DIAGNOSIS — L731 Pseudofolliculitis barbae: Secondary | ICD-10-CM

## 2022-09-04 DIAGNOSIS — L700 Acne vulgaris: Secondary | ICD-10-CM

## 2022-09-04 DIAGNOSIS — L219 Seborrheic dermatitis, unspecified: Secondary | ICD-10-CM

## 2022-09-04 MED ORDER — CLINDAMYCIN PHOSPHATE 1 % EX SOLN: CUTANEOUS | 11 refills | Status: DC

## 2022-09-04 MED ORDER — SPIRONOLACTONE 100 MG PO TABS
100.0000 mg | ORAL_TABLET | Freq: Every day | ORAL | 5 refills | Status: AC
  Filled 2022-09-04: qty 30, 30d supply, fill #0
  Filled 2022-11-17: qty 30, 30d supply, fill #1

## 2022-09-04 MED ORDER — ZORYVE 0.3 % EX FOAM: 1.0000 | Freq: Every day | CUTANEOUS | 3 refills | Status: AC

## 2022-09-04 NOTE — Progress Notes (Signed)
   Follow-Up Visit   Subjective  Erika Henry is a 42 y.o. female who presents for the following: itchy scale behind bilateral ears and right ear canal. Would like refills of clindamycin solution for occasional acne at neck, face.  Gets acne bumps on neck and excess hair growth.   The following portions of the chart were reviewed this encounter and updated as appropriate: medications, allergies, medical history  Review of Systems:  No other skin or systemic complaints except as noted in HPI or Assessment and Plan.  Objective  Well appearing patient in no apparent distress; mood and affect are within normal limits.  A focused examination was performed of the following areas: Face, ears, ear canal Relevant physical exam findings are noted in the Assessment and Plan.    Assessment & Plan   SEBORRHEIC DERMATITIS Exam: Pink patches with greasy scale at BL postauricular crease. Ear canals with mild erythema  Chronic and persistent condition with duration or expected duration over one year. Condition is bothersome/symptomatic for patient. Currently flared.   Seborrheic Dermatitis is a chronic persistent rash characterized by pinkness and scaling most commonly of the mid face but also can occur on the scalp (dandruff), ears; mid chest, mid back and groin.  It tends to be exacerbated by stress and cooler weather.  People who have neurologic disease may experience new onset or exacerbation of existing seborrheic dermatitis.  The condition is not curable but treatable and can be controlled.  Treatment Plan: Start Zoryve Foam Apply to AA postauricular and ear canal QD prn.   ACNE VULGARIS WITH PFB COMPONENT  Exam: Resolving inflammatory papules of the inferior chin with scarring and hypertrichosis  Chronic and persistent condition with duration or expected duration over one year. Condition is symptomatic/ bothersome to patient. Not currently at goal.   Treatment Plan: Start  Spironolactone 100 MG take 1 po QD dsp #30 5Rf. Start with 1/2 tablet daily for a week. If no side effects, may increase to full tablet.  Continue Clindamycin solution QD/BID prn.   Plucking hairs will make condition worse.  Spironolactone can cause increased urination and cause blood pressure to decrease. Please watch for signs of lightheadedness and be cautious when changing position. It can sometimes cause breast tenderness or an irregular period in premenopausal women. It can also increase potassium. The increase in potassium usually is not a concern unless you are taking other medicines that also increase potassium, so please be sure your doctor knows all of the other medications you are taking. This medication should not be taken by pregnant women.  This medicine should also not be taken together with sulfa drugs like Bactrim (trimethoprim/sulfamethexazole).     Return if symptoms worsen or fail to improve.  ICherlyn Labella, CMA, am acting as scribe for Willeen Niece, MD .   Documentation: I have reviewed the above documentation for accuracy and completeness, and I agree with the above.  Willeen Niece, MD

## 2022-09-04 NOTE — Patient Instructions (Addendum)
Spironolactone can cause increased urination and cause blood pressure to decrease. Please watch for signs of lightheadedness and be cautious when changing position. It can sometimes cause breast tenderness or an irregular period in premenopausal women. It can also increase potassium. The increase in potassium usually is not a concern unless you are taking other medicines that also increase potassium, so please be sure your doctor knows all of the other medications you are taking. This medication should not be taken by pregnant women.  This medicine should also not be taken together with sulfa drugs like Bactrim (trimethoprim/sulfamethexazole).    Due to recent changes in healthcare laws, you may see results of your pathology and/or laboratory studies on MyChart before the doctors have had a chance to review them. We understand that in some cases there may be results that are confusing or concerning to you. Please understand that not all results are received at the same time and often the doctors may need to interpret multiple results in order to provide you with the best plan of care or course of treatment. Therefore, we ask that you please give us 2 business days to thoroughly review all your results before contacting the office for clarification. Should we see a critical lab result, you will be contacted sooner.   If You Need Anything After Your Visit  If you have any questions or concerns for your doctor, please call our main line at [phone] and press option 4 to reach your doctor's medical assistant. If no one answers, please leave a voicemail as directed and we will return your call as soon as possible. Messages left after 4 pm will be answered the following business day.   You may also send us a message via MyChart. We typically respond to MyChart messages within 1-2 business days.  For prescription refills, please ask your pharmacy to contact our office. Our fax number is [phone].  If  you have an urgent issue when the clinic is closed that cannot wait until the next business day, you can page your doctor at the number below.    Please note that while we do our best to be available for urgent issues outside of office hours, we are not available 24/7.   If you have an urgent issue and are unable to reach us, you may choose to seek medical care at your doctor's office, retail clinic, urgent care center, or emergency room.  If you have a medical emergency, please immediately call 911 or go to the emergency department.  Pager Numbers  - Dr. Kowalski: [phone]  - Dr. Moye: [phone]  - Dr. Stewart: [phone]  In the event of inclement weather, please call our main line at [phone] for an update on the status of any delays or closures.  Dermatology Medication Tips: Please keep the boxes that topical medications come in in order to help keep track of the instructions about where and how to use these. Pharmacies typically print the medication instructions only on the boxes and not directly on the medication tubes.   If your medication is too expensive, please contact our office at [phone] option 4 or send us a message through MyChart.   We are unable to tell what your co-pay for medications will be in advance as this is different depending on your insurance coverage. However, we may be able to find a substitute medication at lower cost or fill out paperwork to get insurance to cover a needed medication.   If a prior   authorization is required to get your medication covered by your insurance company, please allow us 1-2 business days to complete this process.  Drug prices often vary depending on where the prescription is filled and some pharmacies may offer cheaper prices.  The website www.goodrx.com contains coupons for medications through different pharmacies. The prices here do not account for what the cost may be with help from insurance (it may be cheaper  with your insurance), but the website can give you the price if you did not use any insurance.  - You can print the associated coupon and take it with your prescription to the pharmacy.  - You may also stop by our office during regular business hours and pick up a GoodRx coupon card.  - If you need your prescription sent electronically to a different pharmacy, notify our office through [facility] MyChart or by phone at [phone] option 4.     Si Usted Necesita Algo Despus de Su Visita  Tambin puede enviarnos un mensaje a travs de MyChart. Por lo general respondemos a los mensajes de MyChart en el transcurso de 1 a 2 das hbiles.  Para renovar recetas, por favor pida a su farmacia que se ponga en contacto con nuestra oficina. Nuestro nmero de fax es el [phone].  Si tiene un asunto urgente cuando la clnica est cerrada y que no puede esperar hasta el siguiente da hbil, puede llamar/localizar a su doctor(a) al nmero que aparece a continuacin.   Por favor, tenga en cuenta que aunque hacemos todo lo posible para estar disponibles para asuntos urgentes fuera del horario de oficina, no estamos disponibles las 24 horas del da, los 7 das de la semana.   Si tiene un problema urgente y no puede comunicarse con nosotros, puede optar por buscar atencin mdica  en el consultorio de su doctor(a), en una clnica privada, en un centro de atencin urgente o en una sala de emergencias.  Si tiene una emergencia mdica, por favor llame inmediatamente al 911 o vaya a la sala de emergencias.  Nmeros de bper  - Dr. Kowalski: [phone]  - Dra. Moye: [phone]  - Dra. Stewart: [phone]  En caso de inclemencias del tiempo, por favor llame a nuestra lnea principal al [phone] para una actualizacin sobre el estado de cualquier retraso o cierre.  Consejos para la medicacin en dermatologa: Por favor, guarde las cajas en las que vienen los medicamentos de uso tpico para  ayudarle a seguir las instrucciones sobre dnde y cmo usarlos. Las farmacias generalmente imprimen las instrucciones del medicamento slo en las cajas y no directamente en los tubos del medicamento.   Si su medicamento es muy caro, por favor, pngase en contacto con nuestra oficina llamando al [phone] y presione la opcin 4 o envenos un mensaje a travs de MyChart.   No podemos decirle cul ser su copago por los medicamentos por adelantado ya que esto es diferente dependiendo de la cobertura de su seguro. Sin embargo, es posible que podamos encontrar un medicamento sustituto a menor costo o llenar un formulario para que el seguro cubra el medicamento que se considera necesario.   Si se requiere una autorizacin previa para que su compaa de seguros cubra su medicamento, por favor permtanos de 1 a 2 das hbiles para completar este proceso.  Los precios de los medicamentos varan con frecuencia dependiendo del lugar de dnde se surte la receta y alguna farmacias pueden ofrecer precios ms baratos.  El sitio web www.goodrx.com tiene cupones para   medicamentos de diferentes farmacias. Los precios aqu no tienen en cuenta lo que podra costar con la ayuda del seguro (puede ser ms barato con su seguro), pero el sitio web puede darle el precio si no utiliz ningn seguro.  - Puede imprimir el cupn correspondiente y llevarlo con su receta a la farmacia.  - Tambin puede pasar por nuestra oficina durante el horario de atencin regular y recoger una tarjeta de cupones de GoodRx.  - Si necesita que su receta se enve electrnicamente a una farmacia diferente, informe a nuestra oficina a travs de MyChart de [place] o por telfono llamando al [phone] y presione la opcin 4.  

## 2022-09-18 ENCOUNTER — Other Ambulatory Visit: Payer: Self-pay

## 2022-09-18 MED ORDER — KETOCONAZOLE 2 % EX SHAM
MEDICATED_SHAMPOO | CUTANEOUS | 11 refills | Status: AC
  Filled 2022-09-18: qty 120, 30d supply, fill #0
  Filled 2022-11-17: qty 120, 30d supply, fill #1

## 2022-10-27 ENCOUNTER — Other Ambulatory Visit: Payer: Self-pay

## 2022-10-27 ENCOUNTER — Other Ambulatory Visit: Payer: Self-pay | Admitting: Primary Care

## 2022-10-27 DIAGNOSIS — E039 Hypothyroidism, unspecified: Secondary | ICD-10-CM

## 2022-10-27 MED FILL — Levothyroxine Sodium Tab 112 MCG: ORAL | 90 days supply | Qty: 90 | Fill #0 | Status: AC

## 2022-10-31 ENCOUNTER — Other Ambulatory Visit: Payer: Self-pay

## 2022-11-02 ENCOUNTER — Other Ambulatory Visit: Payer: Self-pay | Admitting: Primary Care

## 2022-11-09 ENCOUNTER — Encounter (INDEPENDENT_AMBULATORY_CARE_PROVIDER_SITE_OTHER): Payer: Self-pay

## 2022-11-17 ENCOUNTER — Other Ambulatory Visit: Payer: Self-pay | Admitting: Oncology

## 2022-11-17 DIAGNOSIS — Z006 Encounter for examination for normal comparison and control in clinical research program: Secondary | ICD-10-CM

## 2022-11-21 ENCOUNTER — Encounter: Payer: Self-pay | Admitting: Pharmacist

## 2022-11-21 ENCOUNTER — Other Ambulatory Visit: Payer: Self-pay

## 2022-11-23 ENCOUNTER — Other Ambulatory Visit: Payer: Self-pay

## 2022-11-23 ENCOUNTER — Other Ambulatory Visit: Payer: Self-pay | Admitting: Primary Care

## 2022-11-23 DIAGNOSIS — Z1231 Encounter for screening mammogram for malignant neoplasm of breast: Secondary | ICD-10-CM

## 2022-11-24 ENCOUNTER — Encounter: Payer: 59 | Admitting: Primary Care

## 2022-12-01 ENCOUNTER — Ambulatory Visit (INDEPENDENT_AMBULATORY_CARE_PROVIDER_SITE_OTHER): Payer: Commercial Managed Care - PPO | Admitting: Primary Care

## 2022-12-01 ENCOUNTER — Encounter: Payer: Self-pay | Admitting: Primary Care

## 2022-12-01 ENCOUNTER — Other Ambulatory Visit: Payer: Self-pay

## 2022-12-01 VITALS — BP 122/78 | HR 88 | Temp 97.4°F | Ht 62.0 in | Wt 242.0 lb

## 2022-12-01 DIAGNOSIS — K219 Gastro-esophageal reflux disease without esophagitis: Secondary | ICD-10-CM | POA: Diagnosis not present

## 2022-12-01 DIAGNOSIS — E039 Hypothyroidism, unspecified: Secondary | ICD-10-CM

## 2022-12-01 DIAGNOSIS — Z0000 Encounter for general adult medical examination without abnormal findings: Secondary | ICD-10-CM

## 2022-12-01 DIAGNOSIS — Z1231 Encounter for screening mammogram for malignant neoplasm of breast: Secondary | ICD-10-CM

## 2022-12-01 DIAGNOSIS — Z6841 Body Mass Index (BMI) 40.0 and over, adult: Secondary | ICD-10-CM

## 2022-12-01 DIAGNOSIS — L409 Psoriasis, unspecified: Secondary | ICD-10-CM

## 2022-12-01 DIAGNOSIS — G43009 Migraine without aura, not intractable, without status migrainosus: Secondary | ICD-10-CM

## 2022-12-01 LAB — COMPREHENSIVE METABOLIC PANEL
ALT: 20 U/L (ref 0–35)
AST: 17 U/L (ref 0–37)
Albumin: 3.8 g/dL (ref 3.5–5.2)
Alkaline Phosphatase: 76 U/L (ref 39–117)
BUN: 13 mg/dL (ref 6–23)
CO2: 26 meq/L (ref 19–32)
Calcium: 8.7 mg/dL (ref 8.4–10.5)
Chloride: 106 meq/L (ref 96–112)
Creatinine, Ser: 0.77 mg/dL (ref 0.40–1.20)
GFR: 94.62 mL/min (ref >60.00)
Glucose, Bld: 82 mg/dL (ref 70–99)
Potassium: 4.4 meq/L (ref 3.5–5.1)
Sodium: 139 meq/L (ref 135–145)
Total Bilirubin: 0.3 mg/dL (ref 0.2–1.2)
Total Protein: 6.8 g/dL (ref 6.0–8.3)

## 2022-12-01 LAB — LIPID PANEL
Cholesterol: 176 mg/dL (ref 0–200)
HDL: 48.5 mg/dL (ref >39.00)
LDL Cholesterol: 113 mg/dL — ABNORMAL HIGH (ref 0–99)
NonHDL: 127
Total CHOL/HDL Ratio: 4
Triglycerides: 69 mg/dL (ref 0.0–149.0)
VLDL: 13.8 mg/dL (ref 0.0–40.0)

## 2022-12-01 LAB — TSH: TSH: 1.86 u[IU]/mL (ref 0.35–5.50)

## 2022-12-01 MED ORDER — SUMATRIPTAN SUCCINATE 50 MG PO TABS
ORAL_TABLET | ORAL | 0 refills | Status: AC
Start: 2022-12-01 — End: ?
  Filled 2022-12-01: qty 9, 15d supply, fill #0

## 2022-12-01 NOTE — Assessment & Plan Note (Signed)
She is taking levothyroxine correctly.   Continue levothyroxine 112 mcg daily.  Repeat TSH pending. 

## 2022-12-01 NOTE — Assessment & Plan Note (Signed)
Deteriorated with semaglutide.  Inappropriate use of Tums. Discussed to start famotidine 20 mg daily.

## 2022-12-01 NOTE — Assessment & Plan Note (Signed)
Controlled overall. Following with dermatology.  Continue Cleocin T 1% solution, clotrimazole-betasone 1-0.05% cream, Zoryve 0.3%, Tretinoin 0.05%, spironolactone 100 mg daily.

## 2022-12-01 NOTE — Assessment & Plan Note (Signed)
Immunizations UTD. Pap smear UTD. Mammogram due, orders placed.  Discussed the importance of a healthy diet and regular exercise in order for weight loss, and to reduce the risk of further co-morbidity.  Exam stable. Labs pending.  Follow up in 1 year for repeat physical.

## 2022-12-01 NOTE — Patient Instructions (Signed)
Stop by the lab prior to leaving today. I will notify you of your results once received.   [facility] to [facility] your [facility].   It was a pleasure to see you today!   

## 2022-12-01 NOTE — Assessment & Plan Note (Addendum)
Controlled.  Continue sumatriptan 50 mg PRN. Refill provided. Continue to monitor.

## 2022-12-01 NOTE — Progress Notes (Signed)
Subjective:    Patient ID: Erika Henry, female    DOB: October 03, 1980, 42 y.o.   MRN: [id number]  HPI  Erika Henry is a very pleasant 42 y.o. female who presents today for complete physical and follow up of chronic conditions.  Immunizations: -Tetanus: Completed in 2023 -Influenza: Completes at work  Diet: Fair diet.  Exercise: No regular exercise.  Eye exam: Completes annually  Dental exam: Completes semi-annually    Pap Smear: Completed in October 2022 Mammogram: Scheduled for next week, but she would like to have this done in [place]. Needs new order.  BP Readings from Last 3 Encounters:  12/01/22 122/78  09/04/22 137/84  08/04/22 136/84   Wt Readings from Last 3 Encounters:  12/01/22 242 lb (109.8 kg)  08/04/22 256 lb (116.1 kg)  11/18/21 241 lb 8 oz (109.5 kg)       Review of Systems  Constitutional:  Negative for unexpected weight change.  HENT:  Negative for rhinorrhea.   Respiratory:  Negative for cough and shortness of breath.   Cardiovascular:  Negative for chest pain.  Gastrointestinal:  Negative for constipation and diarrhea.  Genitourinary:  Negative for difficulty urinating and menstrual problem.  Musculoskeletal:  Negative for arthralgias and myalgias.  Skin:  Negative for rash.  Allergic/Immunologic: Negative for environmental allergies.  Neurological:  Negative for dizziness, numbness and headaches.  Psychiatric/Behavioral:  The patient is not nervous/anxious.          Past Medical History:  Diagnosis Date   ASCUS with positive high risk HPV cervical 2004   neg colpo bx 2/04   GERD (gastroesophageal reflux disease)    Hypothyroid    Lichen sclerosus    Migraines    PCOS (polycystic ovarian syndrome)    Vaccine for human papilloma virus (HPV) types 6, 11, 16, and 18 administered     Social History   Socioeconomic History   Marital status: Married    Spouse name: Not on file   Number of children: Not on file   Years of  education: Not on file   Highest education level: Not on file  Occupational History   Not on file  Tobacco Use   Smoking status: Never   Smokeless tobacco: Never  Vaping Use   Vaping status: Never Used  Substance and Sexual Activity   Alcohol use: Yes    Comment: ocassional   Drug use: Not Currently   Sexual activity: Yes    Birth control/protection: None, Condom  Other Topics Concern   Not on file  Social History Narrative   Married.   No children.   Works as a Clinical biochemist at [employer].   Enjoys exercising, shopping, bowling, traveling.   Social Determinants of Health   Financial Resource Strain: Not on file  Food Insecurity: Not on file  Transportation Needs: Not on file  Physical Activity: Not on file  Stress: Not on file  Social Connections: Not on file  Intimate Partner Violence: Not on file    Past Surgical History:  Procedure Laterality Date   COLPOSCOPY  2004   WISDOM TOOTH EXTRACTION  2005    Family History  Problem Relation Age of Onset   Hyperlipidemia Mother    Hypertension Mother    Hyperlipidemia Father    Hypertension Father    Prostate cancer Father    Kidney cancer Father    Depression Brother    Depression Maternal Grandmother    Diabetes Maternal Grandmother    Stroke  Maternal Grandmother    COPD Maternal Grandfather    Diabetes Maternal Grandfather    Stroke Maternal Grandfather    Depression Paternal Grandmother    Breast cancer Paternal Grandmother 38   Arthritis Paternal Grandfather    Heart disease Paternal Grandfather    Hyperlipidemia Paternal Grandfather    Hypertension Paternal Grandfather    Kidney disease Paternal Grandfather    Lung cancer Paternal Grandfather     No Known Allergies  Current Outpatient Medications on File Prior to Visit  Medication Sig Dispense Refill   bimatoprost (LATISSE) 0.03 % ophthalmic solution Place 1 application. into both eyes at bedtime. Place one drop on applicator and apply evenly  along the skin of the upper eyelid at base of eyelashes once daily at bedtime; repeat procedure for second eye (use a clean applicator). 5 mL 12   Cholecalciferol (VITAMIN D3) 2000 units capsule Take by mouth.     clindamycin (CLEOCIN T) 1 % external solution Apply topically as directed. Apply once to twice daily to affected areas on neck and face as needed for flares 30 mL 11   hydrocortisone 2.5 % cream Apply 1 Application topically 2 (two) times daily for up to 2 weeks as needed for rash in groin 30 g 2   ketoconazole (NIZORAL) 2 % shampoo Apply to scalp and ears once daily as directed. 120 mL 11   levothyroxine (SYNTHROID) 112 MCG tablet Take 1 tablet by mouth every morning on an empty stomach with water only.  No food or other medications for 30 minutes. 90 tablet 0   Multiple Vitamin (MULTI-VITAMINS) TABS Take by mouth.     Roflumilast, Antiseborrheic, (ZORYVE) 0.3 % FOAM Apply 1 Application topically daily. Apply to affected areas in and behind ears. 60 g 3   Semaglutide, 1 MG/DOSE, 2 MG/1.5ML SOPN Inject 1.5 mg into the skin once a week.     spironolactone (ALDACTONE) 100 MG tablet Take 1 tablet (100 mg total) by mouth daily. 30 tablet 5   Tretinoin (ALTRENO) 0.05 % LOTN Apply 1 Application topically at bedtime. Qhs to face 45 g 11   clotrimazole-betamethasone (LOTRISONE) cream Apply externally twice a day for 2 weeks (Patient not taking: Reported on 08/04/2022) 45 g 0   No current facility-administered medications on file prior to visit.    BP 122/78   Pulse 88   Temp (!) 97.4 F (36.3 C) (Temporal)   Ht 5\' 2"  (1.575 m)   Wt 242 lb (109.8 kg)   LMP 11/24/2022   SpO2 98%   BMI 44.26 kg/m  Objective:   Physical Exam HENT:     Right Ear: Tympanic membrane and ear canal normal.     Left Ear: Tympanic membrane and ear canal normal.     Nose: Nose normal.  Eyes:     Conjunctiva/sclera: Conjunctivae normal.     Pupils: Pupils are equal, round, and reactive to light.  Neck:      Thyroid: No thyromegaly.  Cardiovascular:     Rate and Rhythm: Normal rate and regular rhythm.     Heart sounds: No murmur heard. Pulmonary:     Effort: Pulmonary effort is normal.     Breath sounds: Normal breath sounds. No rales.  Abdominal:     General: Bowel sounds are normal.     Palpations: Abdomen is soft.     Tenderness: There is no abdominal tenderness.  Musculoskeletal:        General: Normal range of motion.  Cervical back: Neck supple.  Lymphadenopathy:     Cervical: No cervical adenopathy.  Skin:    General: Skin is warm and dry.     Findings: No rash.  Neurological:     Mental Status: She is alert and oriented to person, place, and time.     Cranial Nerves: No cranial nerve deficit.     Deep Tendon Reflexes: Reflexes are normal and symmetric.  Psychiatric:        Mood and Affect: Mood normal.           Assessment & Plan:  Preventative health care Assessment & Plan: Immunizations UTD.  Pap smear UTD. Mammogram due, orders placed.  Discussed the importance of a healthy diet and regular exercise in order for weight loss, and to reduce the risk of further co-morbidity.  Exam stable. Labs pending.  Follow up in 1 year for repeat physical.    Screening mammogram for breast cancer -     3D Screening Mammogram, Left and Right; Future  Class 3 severe obesity due to excess calories without serious comorbidity with body mass index (BMI) of 40.0 to 44.9 in adult [facility]) Assessment & Plan: Following with online weight loss center.  Continue semaglutide 1.5 mg weekly.  Encouraged exercise.   Orders: -     Lipid panel  Migraine without aura and without status migrainosus, not intractable Assessment & Plan: Controlled.  Continue sumatriptan 50 mg PRN. Refill provided. Continue to monitor.   Orders: -     SUMAtriptan Succinate; Take 1 tablet by mouth at migraine onset. May repeat in 2 hours if headache persists or recurs.  Dispense: 9 tablet;  Refill: 0  Gastro-esophageal reflux disease without esophagitis Assessment & Plan: Deteriorated with semaglutide.  Inappropriate use of Tums. Discussed to start famotidine 20 mg daily.   Hypothyroidism (acquired) Assessment & Plan: She is taking levothyroxine correctly.  Continue levothyroxine 112 mcg daily. Repeat TSH pending.  Orders: -     Comprehensive metabolic panel -     TSH  Psoriasis Assessment & Plan: Controlled overall. Following with dermatology.  Continue Cleocin T 1% solution, clotrimazole-betasone 1-0.05% cream, Zoryve 0.3%, Tretinoin 0.05%, spironolactone 100 mg daily.          Doreene Nest, NP

## 2022-12-01 NOTE — Assessment & Plan Note (Signed)
Following with online weight loss center.  Continue semaglutide 1.5 mg weekly.  Encouraged exercise.

## 2022-12-08 ENCOUNTER — Ambulatory Visit
Admission: RE | Admit: 2022-12-08 | Discharge: 2022-12-08 | Disposition: A | Discharge status: Home / Self Care | Payer: Commercial Managed Care - PPO | Source: Ambulatory Visit | Attending: Primary Care | Admitting: Primary Care

## 2022-12-08 DIAGNOSIS — Z1231 Encounter for screening mammogram for malignant neoplasm of breast: Secondary | ICD-10-CM | POA: Diagnosis not present

## 2022-12-12 ENCOUNTER — Other Ambulatory Visit: Payer: Self-pay

## 2022-12-28 NOTE — Progress Notes (Signed)
PCP:  Doreene Nest, NP   Chief Complaint  Patient presents with   Gynecologic Exam    No concerns     HPI:      Erika Henry is a 42 y.o. No obstetric history on file. who LMP was Patient's last menstrual period was 12/24/2022 (approximate)., presents today for her annual examination.  Her menses are monthly, lasting 6-7 days, mod to heavy flow.  Dysmenorrhea mild, occurring first 1-2 days of flow. She does not have intermenstrual bleeding. Hx of PCOS.  OCPs in past raised BP. Hx of leio per pt (u/s done by Dr. Patrecia Pace in 2015 was neg for [facility]). Has subseptate uterus, no evid of PCOS or leio on 11/22 GYN u/s.   Sex activity: single partner, contraception - none/condoms. Conception ok. Declines BC. Has normal discomfort with sex, no bleeding/dryness. Last Pap: 01/19/21 Results were no abnormalities/neg HPV DNA Hx of STDs: HPV in 2004  Has had external vag itching without increased d/c/odor for a couple yrs now. Given lotrisone crm to use but has itching daily, particularly at end of day. Using scented soap, no dryer sheets. Wears cotton underwear, no thongs, no pantyliners.   Has skin tag LT inner thigh that she would like removed.   Mammogram: 12/08/22 Results were normal, repeat in 12 months.   There is a FH of breast cancer in her PGM, genetic testing not indicated. There is no FH of ovarian cancer. The patient does do self-breast exams.  Tobacco use: The patient denies current or previous tobacco use. Alcohol use: none No drug use.  Exercise: mod active  She does get adequate calcium and Vitamin D in her diet. Labs with PCP.   Past Medical History:  Diagnosis Date   ASCUS with positive high risk HPV cervical 2004   neg colpo bx 2/04   GERD (gastroesophageal reflux disease)    Hypothyroid    Lichen sclerosus    Migraines    PCOS (polycystic ovarian syndrome)    Vaccine for human papilloma virus (HPV) types 6, 11, 16, and 18 administered     Past  Surgical History:  Procedure Laterality Date   COLPOSCOPY  2004   WISDOM TOOTH EXTRACTION  2005    Family History  Problem Relation Age of Onset   Hyperlipidemia Mother    Hypertension Mother    Hyperlipidemia Father    Hypertension Father    Prostate cancer Father    Kidney cancer Father    Depression Brother    Depression Maternal Grandmother    Diabetes Maternal Grandmother    Stroke Maternal Grandmother    COPD Maternal Grandfather    Diabetes Maternal Grandfather    Stroke Maternal Grandfather    Depression Paternal Grandmother    Breast cancer Paternal Grandmother 66   Arthritis Paternal Grandfather    Heart disease Paternal Grandfather    Hyperlipidemia Paternal Grandfather    Hypertension Paternal Grandfather    Kidney disease Paternal Grandfather    Lung cancer Paternal Grandfather     Social History   Socioeconomic History   Marital status: Married    Spouse name: Not on file   Number of children: Not on file   Years of education: Not on file   Highest education level: Not on file  Occupational History   Not on file  Tobacco Use   Smoking status: Never   Smokeless tobacco: Never  Vaping Use   Vaping status: Never Used  Substance and Sexual Activity  Alcohol use: Yes    Comment: ocassional   Drug use: Not Currently   Sexual activity: Yes    Birth control/protection: None, Condom  Other Topics Concern   Not on file  Social History Narrative   Married.   No children.   Works as a Clinical biochemist at [employer].   Enjoys exercising, shopping, bowling, traveling.   Social Determinants of Health   Financial Resource Strain: Not on file  Food Insecurity: Not on file  Transportation Needs: Not on file  Physical Activity: Not on file  Stress: Not on file  Social Connections: Not on file  Intimate Partner Violence: Not on file    Outpatient Medications Prior to Visit  Medication Sig Dispense Refill   bimatoprost (LATISSE) 0.03 % ophthalmic  solution Place 1 application. into both eyes at bedtime. Place one drop on applicator and apply evenly along the skin of the upper eyelid at base of eyelashes once daily at bedtime; repeat procedure for second eye (use a clean applicator). 5 mL 12   Cholecalciferol (VITAMIN D3) 2000 units capsule Take by mouth.     clindamycin (CLEOCIN T) 1 % external solution Apply topically as directed. Apply once to twice daily to affected areas on neck and face as needed for flares 30 mL 11   hydrocortisone 2.5 % cream Apply 1 Application topically 2 (two) times daily for up to 2 weeks as needed for rash in groin 30 g 2   ketoconazole (NIZORAL) 2 % shampoo Apply to scalp and ears once daily as directed. 120 mL 11   levothyroxine (SYNTHROID) 112 MCG tablet Take 1 tablet by mouth every morning on an empty stomach with water only.  No food or other medications for 30 minutes. 90 tablet 0   Multiple Vitamin (MULTI-VITAMINS) TABS Take by mouth.     Roflumilast, Antiseborrheic, (ZORYVE) 0.3 % FOAM Apply 1 Application topically daily. Apply to affected areas in and behind ears. 60 g 3   Semaglutide, 1 MG/DOSE, 2 MG/1.5ML SOPN Inject 1.5 mg into the skin once a week.     spironolactone (ALDACTONE) 100 MG tablet Take 1 tablet (100 mg total) by mouth daily. 30 tablet 5   SUMAtriptan (IMITREX) 50 MG tablet Take 1 tablet by mouth at migraine onset. May repeat in 2 hours if headache persists or recurs. 9 tablet 0   Tretinoin (ALTRENO) 0.05 % LOTN Apply 1 Application topically at bedtime. Qhs to face 45 g 11   clotrimazole-betamethasone (LOTRISONE) cream Apply externally twice a day for 2 weeks 45 g 0   No facility-administered medications prior to visit.    ROS:  Review of Systems  Constitutional:  Negative for fatigue, fever and unexpected weight change.  Respiratory:  Negative for cough, shortness of breath and wheezing.   Cardiovascular:  Negative for chest pain, palpitations and leg swelling.  Gastrointestinal:   Negative for blood in stool, constipation, diarrhea, nausea and vomiting.  Endocrine: Negative for cold intolerance, heat intolerance and polyuria.  Genitourinary:  Negative for dyspareunia, dysuria, flank pain, frequency, genital sores, hematuria, pelvic pain, urgency, vaginal bleeding, vaginal discharge and vaginal pain.  Musculoskeletal:  Negative for back pain, joint swelling and myalgias.  Skin:  Negative for rash.  Neurological:  Negative for dizziness, syncope, light-headedness, numbness and headaches.  Hematological:  Negative for adenopathy.  Psychiatric/Behavioral:  Negative for agitation, confusion, sleep disturbance and suicidal ideas. The patient is not nervous/anxious.   BREAST: No symptoms   Objective: BP 122/80   Ht  5\' 3"  (1.6 m)   Wt 244 lb (110.7 kg)   LMP 12/24/2022 (Approximate)   BMI 43.22 kg/m    Physical Exam Constitutional:      Appearance: She is well-developed.  Genitourinary:     Vulva normal.     Genitourinary Comments: BILAT LABIA MINORA WITH HYPERTROPHY/CHRONIC IRRITATION; PALE COLOR; FEW FISSURES     Right Labia: rash.     Right Labia: No tenderness or lesions.    Left Labia: rash.     Left Labia: No tenderness or lesions.       No vaginal discharge, erythema or tenderness.      Right Adnexa: not tender and no mass present.    Left Adnexa: not tender and no mass present.    No cervical motion tenderness, friability or polyp.     Uterus is not enlarged or tender.  Breasts:    Right: No mass, nipple discharge, skin change or tenderness.     Left: No mass, nipple discharge, skin change or tenderness.  Neck:     Thyroid: No thyromegaly.  Cardiovascular:     Rate and Rhythm: Normal rate and regular rhythm.     Heart sounds: Normal heart sounds. No murmur heard. Pulmonary:     Effort: Pulmonary effort is normal.     Breath sounds: Normal breath sounds.  Abdominal:     Palpations: Abdomen is soft.     Tenderness: There is no abdominal  tenderness. There is no guarding or rebound.  Musculoskeletal:        General: Normal range of motion.     Cervical back: Normal range of motion.  Lymphadenopathy:     Cervical: No cervical adenopathy.  Neurological:     General: No focal deficit present.     Mental Status: She is alert and oriented to person, place, and time.     Cranial Nerves: No cranial nerve deficit.  Skin:    General: Skin is warm and dry.  Psychiatric:        Mood and Affect: Mood normal.        Behavior: Behavior normal.        Thought Content: Thought content normal.        Judgment: Judgment normal.  Vitals reviewed.     Assessment/Plan: Encounter for annual routine gynecological examination  Chronic vaginitis - Plan: clotrimazole-betamethasone (LOTRISONE) cream; bilat labia minora. Question fungal vs LS. Pt to treat BID for 2 wks with lotrisone crm then RTO for f/u. If LS, will treat with clobetasol. Pt to change to Dove sens skin soap.   Skin tag--will numb and remove at vaginitis f/u in 2 wks.             Meds ordered this encounter  Medications   clotrimazole-betamethasone (LOTRISONE) cream    Sig: Apply externally twice a day for 2 weeks    Dispense:  45 g    Refill:  0    Order Specific Question:   Supervising Provider    Answer:   Waymon Budge    GYN counsel adequate intake of calcium and vitamin D, diet and exercise     F/U  Return in about 2 weeks (around 01/15/2023) for vaginitis f/u.  Jahzir Strohmeier B. Annaleigha Woo, PA-C 01/01/2023 4:04 PM

## 2023-01-01 ENCOUNTER — Other Ambulatory Visit: Payer: Self-pay

## 2023-01-01 ENCOUNTER — Ambulatory Visit: Payer: Commercial Managed Care - PPO | Admitting: Obstetrics and Gynecology

## 2023-01-01 ENCOUNTER — Encounter: Payer: Self-pay | Admitting: Obstetrics and Gynecology

## 2023-01-01 VITALS — BP 122/80 | Ht 63.0 in | Wt 244.0 lb

## 2023-01-01 DIAGNOSIS — N939 Abnormal uterine and vaginal bleeding, unspecified: Secondary | ICD-10-CM

## 2023-01-01 DIAGNOSIS — Z01419 Encounter for gynecological examination (general) (routine) without abnormal findings: Secondary | ICD-10-CM | POA: Diagnosis not present

## 2023-01-01 DIAGNOSIS — N761 Subacute and chronic vaginitis: Secondary | ICD-10-CM

## 2023-01-01 DIAGNOSIS — L918 Other hypertrophic disorders of the skin: Secondary | ICD-10-CM

## 2023-01-01 MED ORDER — CLOTRIMAZOLE-BETAMETHASONE 1-0.05 % EX CREA
TOPICAL_CREAM | CUTANEOUS | 0 refills | Status: DC
Start: 2023-01-01 — End: 2023-01-23
  Filled 2023-01-01: qty 45, 30d supply, fill #0

## 2023-01-01 NOTE — Patient Instructions (Signed)
I value your feedback and you entrusting us with your care. If you get a [facility] patient survey, I would appreciate you taking the time to let us know about your experience today. Thank you! ? ? ?

## 2023-01-18 ENCOUNTER — Ambulatory Visit: Payer: Commercial Managed Care - PPO | Admitting: Obstetrics and Gynecology

## 2023-01-18 ENCOUNTER — Encounter: Payer: Self-pay | Admitting: Obstetrics and Gynecology

## 2023-01-18 ENCOUNTER — Other Ambulatory Visit (HOSPITAL_COMMUNITY)
Admission: RE | Admit: 2023-01-18 | Discharge: 2023-01-18 | Disposition: A | Discharge status: Home / Self Care | Payer: Commercial Managed Care - PPO | Source: Ambulatory Visit | Attending: Obstetrics and Gynecology | Admitting: Obstetrics and Gynecology

## 2023-01-18 VITALS — BP 109/73 | HR 88 | Ht 63.0 in | Wt 239.0 lb

## 2023-01-18 DIAGNOSIS — L859 Epidermal thickening, unspecified: Secondary | ICD-10-CM | POA: Diagnosis not present

## 2023-01-18 DIAGNOSIS — L918 Other hypertrophic disorders of the skin: Secondary | ICD-10-CM | POA: Diagnosis not present

## 2023-01-18 DIAGNOSIS — N761 Subacute and chronic vaginitis: Secondary | ICD-10-CM

## 2023-01-18 DIAGNOSIS — D2270 Melanocytic nevi of unspecified lower limb, including hip: Secondary | ICD-10-CM | POA: Diagnosis not present

## 2023-01-18 DIAGNOSIS — N9069 Other specified hypertrophy of vulva: Secondary | ICD-10-CM | POA: Diagnosis not present

## 2023-01-18 DIAGNOSIS — N904 Leukoplakia of vulva: Secondary | ICD-10-CM | POA: Diagnosis not present

## 2023-01-18 NOTE — Progress Notes (Signed)
Doreene Nest, NP   Chief Complaint  Patient presents with   Follow-up    Little better since last visit, skin tag removal today.    HPI:      [name] is a 42 y.o. [id number] whose LMP was Patient's last menstrual period was 12/24/2022 (approximate)., presents today for 2 wk f/u for chronic vag itching. Has had sx for a couple yrs. Treated with 2 wks lotrisone crm BID in case fungal etiology. Pt states itching better but still sx at times. PMH says Lichen sclerosus but pt has never had bx or seen derm for skin issues, so she is unsure where that came from.  Also would like skin tag removal since it is on inner thigh and very irritating to pt.   01/01/23 NOTE: Has had external vag itching without increased d/c/odor for a couple yrs now. Given lotrisone crm to use in past but has itching daily, particularly at end of day. Using scented soap, no dryer sheets. Wears cotton underwear, no thongs, no pantyliners.   Patient Active Problem List   Diagnosis Date Noted   Migraines 11/18/2021   Elevated liver function tests 05/06/2021   Class 3 severe obesity due to excess calories with body mass index (BMI) of 40.0 to 44.9 in adult [facility]) 04/22/2021   Preventative health care 10/11/2018   Vertigo 03/26/2018   PCOS (polycystic ovarian syndrome) 02/09/2017   Psoriasis 02/09/2017   Hypothyroidism (acquired) 05/24/2016   Vitamin D deficiency disease 05/24/2016   Gastro-esophageal reflux disease without esophagitis 08/02/2015    Past Surgical History:  Procedure Laterality Date   COLPOSCOPY  2004   WISDOM TOOTH EXTRACTION  2005    Family History  Problem Relation Age of Onset   Hyperlipidemia Mother    Hypertension Mother    Hyperlipidemia Father    Hypertension Father    Prostate cancer Father    Kidney cancer Father    Depression Brother    Depression Maternal Grandmother    Diabetes Maternal Grandmother    Stroke Maternal Grandmother    COPD Maternal Grandfather     Diabetes Maternal Grandfather    Stroke Maternal Grandfather    Depression Paternal Grandmother    Breast cancer Paternal Grandmother 51   Arthritis Paternal Grandfather    Heart disease Paternal Grandfather    Hyperlipidemia Paternal Grandfather    Hypertension Paternal Grandfather    Kidney disease Paternal Grandfather    Lung cancer Paternal Grandfather     Social History   Socioeconomic History   Marital status: Married    Spouse name: Not on file   Number of children: Not on file   Years of education: Not on file   Highest education level: Not on file  Occupational History   Not on file  Tobacco Use   Smoking status: Never   Smokeless tobacco: Never  Vaping Use   Vaping status: Never Used  Substance and Sexual Activity   Alcohol use: Yes    Comment: ocassional   Drug use: Not Currently   Sexual activity: Yes    Birth control/protection: None, Condom  Other Topics Concern   Not on file  Social History Narrative   Married.   No children.   Works as a Clinical biochemist at [employer].   Enjoys exercising, shopping, bowling, traveling.   Social Determinants of Health   Financial Resource Strain: Not on file  Food Insecurity: Not on file  Transportation Needs: Not on file  Physical Activity: Not on file  Stress: Not on file  Social Connections: Not on file  Intimate Partner Violence: Not on file    Outpatient Medications Prior to Visit  Medication Sig Dispense Refill   bimatoprost (LATISSE) 0.03 % ophthalmic solution Place 1 application. into both eyes at bedtime. Place one drop on applicator and apply evenly along the skin of the upper eyelid at base of eyelashes once daily at bedtime; repeat procedure for second eye (use a clean applicator). 5 mL 12   Cholecalciferol (VITAMIN D3) 2000 units capsule Take by mouth.     clindamycin (CLEOCIN T) 1 % external solution Apply topically as directed. Apply once to twice daily to affected areas on neck and face as needed  for flares 30 mL 11   clotrimazole-betamethasone (LOTRISONE) cream Apply externally twice a day for 2 weeks 45 g 0   hydrocortisone 2.5 % cream Apply 1 Application topically 2 (two) times daily for up to 2 weeks as needed for rash in groin 30 g 2   ketoconazole (NIZORAL) 2 % shampoo Apply to scalp and ears once daily as directed. 120 mL 11   levothyroxine (SYNTHROID) 112 MCG tablet Take 1 tablet by mouth every morning on an empty stomach with water only.  No food or other medications for 30 minutes. 90 tablet 0   Multiple Vitamin (MULTI-VITAMINS) TABS Take by mouth.     Roflumilast, Antiseborrheic, (ZORYVE) 0.3 % FOAM Apply 1 Application topically daily. Apply to affected areas in and behind ears. 60 g 3   Semaglutide, 1 MG/DOSE, 2 MG/1.5ML SOPN Inject 1.5 mg into the skin once a week.     spironolactone (ALDACTONE) 100 MG tablet Take 1 tablet (100 mg total) by mouth daily. 30 tablet 5   SUMAtriptan (IMITREX) 50 MG tablet Take 1 tablet by mouth at migraine onset. May repeat in 2 hours if headache persists or recurs. 9 tablet 0   Tretinoin (ALTRENO) 0.05 % LOTN Apply 1 Application topically at bedtime. Qhs to face 45 g 11   No facility-administered medications prior to visit.      ROS:  Review of Systems  Constitutional:  Negative for fever.  Gastrointestinal:  Negative for blood in stool, constipation, diarrhea, nausea and vomiting.  Genitourinary:  Negative for dyspareunia, dysuria, flank pain, frequency, hematuria, urgency, vaginal bleeding, vaginal discharge and vaginal pain.  Musculoskeletal:  Negative for back pain.  Skin:  Negative for rash.   BREAST: No symptoms   OBJECTIVE:   Vitals:  BP 109/73   Pulse 88   Ht 5\' 3"  (1.6 m)   Wt 239 lb (108.4 kg)   LMP 12/24/2022 (Approximate)   BMI 42.34 kg/m   Physical Exam Vitals reviewed.  Constitutional:      Appearance: She is well-developed.  Pulmonary:     Effort: Pulmonary effort is normal.  Genitourinary:    Labia:         Right: Rash present.        Left: Rash present.     Musculoskeletal:        General: Normal range of motion.     Cervical back: Normal range of motion.  Skin:    General: Skin is warm and dry.  Neurological:     General: No focal deficit present.     Mental Status: She is alert and oriented to person, place, and time.     Cranial Nerves: No cranial nerve deficit.  Psychiatric:        Mood  and Affect: Mood normal.        Behavior: Behavior normal.        Thought Content: Thought content normal.        Judgment: Judgment normal.    VULVAR BIOPSY NOTE The indications for vulvar biopsy (rule out neoplasia, establish lichen sclerosus diagnosis) were reviewed.   Risks of the biopsy including pain, bleeding, infection. The patient stated understanding and agreed to undergo procedure today. Consent was signed,  time out performed.   The patient's vulva was prepped with Betadine. 1% lidocaine was injected into area of concern. A 3 -mm punch biopsy was done, biopsy tissue was picked up with sterile forceps and sterile scissors were used to excise the lesion.  Small bleeding was noted and hemostasis was achieved using silver nitrate sticks.  The patient tolerated the procedure well. Post-procedure instructions  (pelvic rest for one week) were given to the patient. The patient is to call with heavy bleeding, fever greater than 100.4, foul smelling vaginal discharge or other concerns.   SKIN TAG REMOVAL: Area cleaned with alcohol wipe. Tag removed with scissors, bleeding stopped with silver nitrate. Bandaid placed over area.   Assessment/Plan: Chronic vaginitis - Plan: Surgical pathology; vulvar bx today. Will f/u with results and tx. Discussed LS at possibility.   Skin tag - Plan: Surgical pathology; removed and hemostasis achieved. Wound care instructions. F/u prn.     Return if symptoms worsen or fail to improve.  Danaria Larsen B. Elishah Ashmore, PA-C 01/18/2023 4:42 PM

## 2023-01-18 NOTE — Patient Instructions (Signed)
I value your feedback and you entrusting us with your care. If you get a [facility] patient survey, I would appreciate you taking the time to let us know about your experience today. Thank you! ? ? ?

## 2023-01-23 ENCOUNTER — Other Ambulatory Visit: Payer: Self-pay

## 2023-01-23 LAB — SURGICAL PATHOLOGY

## 2023-01-23 MED ORDER — CLOBETASOL PROPIONATE 0.05 % EX OINT
1.0000 | TOPICAL_OINTMENT | Freq: Every day | CUTANEOUS | 0 refills | Status: AC
  Filled 2023-01-23: qty 30, 30d supply, fill #0

## 2023-01-23 NOTE — Addendum Note (Signed)
Addended by: Althea Grimmer B on: 01/23/2023 01:00 PM   Modules accepted: Orders

## 2023-01-26 ENCOUNTER — Other Ambulatory Visit: Payer: Self-pay

## 2023-01-26 ENCOUNTER — Other Ambulatory Visit: Payer: Self-pay | Admitting: Primary Care

## 2023-01-26 DIAGNOSIS — E039 Hypothyroidism, unspecified: Secondary | ICD-10-CM

## 2023-01-26 MED ORDER — LEVOTHYROXINE SODIUM 112 MCG PO TABS
112.0000 ug | ORAL_TABLET | Freq: Every morning | ORAL | 2 refills | Status: DC
  Filled 2023-01-26: qty 90, 90d supply, fill #0
  Filled 2023-04-24: qty 90, 90d supply, fill #1
  Filled 2023-07-27: qty 90, 90d supply, fill #2

## 2023-02-07 ENCOUNTER — Other Ambulatory Visit: Payer: Self-pay

## 2023-02-07 MED ORDER — CLINDAMYCIN PHOSPHATE 1 % EX SOLN
1.0000 | CUTANEOUS | 11 refills | Status: AC
  Filled 2023-02-07: qty 60, 30d supply, fill #0
  Filled 2023-07-24: qty 60, 30d supply, fill #1
  Filled 2023-10-29: qty 60, 30d supply, fill #2

## 2023-02-07 NOTE — Progress Notes (Signed)
Pt requests refill of clindamycin solution.

## 2023-02-12 ENCOUNTER — Other Ambulatory Visit: Payer: Self-pay

## 2023-02-12 ENCOUNTER — Ambulatory Visit: Payer: Commercial Managed Care - PPO | Admitting: Dermatology

## 2023-02-12 DIAGNOSIS — L719 Rosacea, unspecified: Secondary | ICD-10-CM

## 2023-02-12 DIAGNOSIS — Z7189 Other specified counseling: Secondary | ICD-10-CM | POA: Diagnosis not present

## 2023-02-12 MED ORDER — AZELAIC ACID 15 % EX GEL
1.0000 | CUTANEOUS | 11 refills | Status: AC
  Filled 2023-02-12: qty 50, 30d supply, fill #0

## 2023-02-12 NOTE — Progress Notes (Signed)
   Follow-Up Visit   Subjective  Erika Henry is a 42 y.o. female who presents for the following: check face, has had redness ~38m, pt feels like face flushing especially after exertion and possibly eating tomato based foods, she gets a few bumps, no treatment at this time other than spf.  Flares on the weekends, no problem today.    The following portions of the chart were reviewed this encounter and updated as appropriate: medications, allergies, medical history  Review of Systems:  No other skin or systemic complaints except as noted in HPI or Assessment and Plan.  Objective  Well appearing patient in no apparent distress; mood and affect are within normal limits.   A focused examination was performed of the following areas: face  Relevant exam findings are noted in the Assessment and Plan.    Assessment & Plan   ROSACEA Exam Mid face erythema malar cheeks, chin  Chronic and persistent condition with duration or expected duration over one year. Condition is symptomatic / bothersome to patient.  Currently not flared.   Rosacea is a chronic progressive skin condition usually affecting the face of adults, causing redness and/or acne bumps. It is treatable but not curable. It sometimes affects the eyes (ocular rosacea) as well. It may respond to topical and/or systemic medication and can flare with stress, sun exposure, alcohol, exercise, topical steroids (including hydrocortisone/cortisone 10) and some foods.  Daily application of broad spectrum spf 30+ sunscreen to face is recommended to reduce flares.  Patient denies grittiness of the eyes  Treatment Plan Discussed Rosacea and triggers, try to identify triggers and avoid when possible Discussed Rhofade as treatment for more persistent redness, also BBL laser Start Finacea gel qd/bid to face  Cont SPF qam  Counseling for BBL / IPL / Laser and Coordination of Care Discussed the treatment option of Broad Band Light (BBL)  /Intense Pulsed Light (IPL)/ Laser for skin discoloration, including brown spots and redness.  Typically we recommend at least 1-3 treatment sessions about 5-8 weeks apart for best results.  Cannot have tanned skin when BBL performed, and regular use of sunscreen/photoprotection is advised after the procedure to help maintain results. The patient's condition may also require "maintenance treatments" in the future.  The fee for BBL / laser treatments is $350 per treatment session for the whole face.  A fee can be quoted for other parts of the body.  Insurance typically does not pay for BBL/laser treatments and therefore the fee is an out-of-pocket cost. Recommend prophylactic valtrex treatment. Once scheduled for procedure, will send Rx in prior to patient's appointment.    Return if symptoms worsen or fail to improve.  I, Ardis Rowan, RMA, am acting as scribe for Willeen Niece, MD .   Documentation: I have reviewed the above documentation for accuracy and completeness, and I agree with the above.  Willeen Niece, MD

## 2023-02-12 NOTE — Patient Instructions (Signed)

## 2023-03-06 ENCOUNTER — Other Ambulatory Visit: Payer: Self-pay

## 2023-03-06 ENCOUNTER — Ambulatory Visit: Payer: Commercial Managed Care - PPO | Admitting: Family Medicine

## 2023-03-06 DIAGNOSIS — M25562 Pain in left knee: Secondary | ICD-10-CM | POA: Diagnosis not present

## 2023-03-06 MED ORDER — MELOXICAM 15 MG PO TABS
15.0000 mg | ORAL_TABLET | Freq: Every day | ORAL | 0 refills | Status: DC
  Filled 2023-03-06: qty 90, 90d supply, fill #0

## 2023-03-07 ENCOUNTER — Ambulatory Visit: Payer: Commercial Managed Care - PPO | Admitting: Family Medicine

## 2023-04-20 ENCOUNTER — Other Ambulatory Visit: Payer: Self-pay

## 2023-04-20 ENCOUNTER — Ambulatory Visit: Payer: 59 | Admitting: Primary Care

## 2023-04-20 VITALS — BP 102/68 | HR 78 | Temp 98.3°F | Ht 63.0 in | Wt 222.0 lb

## 2023-04-20 DIAGNOSIS — E039 Hypothyroidism, unspecified: Secondary | ICD-10-CM

## 2023-04-20 DIAGNOSIS — E6609 Other obesity due to excess calories: Secondary | ICD-10-CM | POA: Diagnosis not present

## 2023-04-20 DIAGNOSIS — E282 Polycystic ovarian syndrome: Secondary | ICD-10-CM

## 2023-04-20 DIAGNOSIS — Z6839 Body mass index (BMI) 39.0-39.9, adult: Secondary | ICD-10-CM | POA: Diagnosis not present

## 2023-04-20 DIAGNOSIS — E66812 Obesity, class 2: Secondary | ICD-10-CM | POA: Diagnosis not present

## 2023-04-20 LAB — POCT GLYCOSYLATED HEMOGLOBIN (HGB A1C): Hemoglobin A1C: 5.2 % (ref 4.0–5.6)

## 2023-04-20 MED ORDER — ZEPBOUND 10 MG/0.5ML ~~LOC~~ SOAJ
10.0000 mg | SUBCUTANEOUS | 0 refills | Status: DC
  Filled 2023-04-20: qty 2, 28d supply, fill #0

## 2023-04-20 NOTE — Progress Notes (Signed)
Subjective:    Patient ID: Erika Henry, female    DOB: 01-24-1981, 43 y.o.   MRN: [id number]  HPI  Erika Henry is a very pleasant 43 y.o. female with a history of migraines, GERD, hypothyroidism, PCOS, psoriasis, vitamin D deficiency, obesity who presents today to GLP-1 compounded treatment.  Currently following with [facility] and is managed on compounded tirzepitide 10 mg weekly for which she gets at [facility].   She was initiated on tirzepepatide 10 mg in May 2024 and has lost 34 pounds on our scales. She has been walking at least 30 minutes per day, has been drinking 5 bottles of water daily. She has cut back on fried food and sweets. Her portion sizes are smaller. She is not eating out as much.  She is paying $300 per month for her current prescription but would like to see if insurance would cover a prescription for Zepbound. She is now on her husband's insurance.   Body mass index is 39.33 kg/m.   Wt Readings from Last 3 Encounters:  04/20/23 222 lb (100.7 kg)  01/18/23 239 lb (108.4 kg)  01/01/23 244 lb (110.7 kg)        Review of Systems  Respiratory:  Negative for shortness of breath.   Cardiovascular:  Negative for chest pain.  Gastrointestinal:  Negative for abdominal pain, constipation and diarrhea.         Past Medical History:  Diagnosis Date   ASCUS with positive high risk HPV cervical 2004   neg colpo bx 2/04   GERD (gastroesophageal reflux disease)    Hypothyroid    Lichen sclerosus    Migraines    PCOS (polycystic ovarian syndrome)    Vaccine for human papilloma virus (HPV) types 6, 11, 16, and 18 administered     Social History   Socioeconomic History   Marital status: Married    Spouse name: Not on file   Number of children: Not on file   Years of education: Not on file   Highest education level: Associate degree: academic program  Occupational History   Not on file  Tobacco Use   Smoking status: Never   Smokeless tobacco:  Never  Vaping Use   Vaping status: Never Used  Substance and Sexual Activity   Alcohol use: Yes    Comment: ocassional   Drug use: Not Currently   Sexual activity: Yes    Birth control/protection: None, Condom  Other Topics Concern   Not on file  Social History Narrative   Married.   No children.   Works as a Clinical biochemist at [employer].   Enjoys exercising, shopping, bowling, traveling.   Social Drivers of Corporate investment banker Strain: Low Risk  (04/20/2023)   Overall Financial Resource Strain (CARDIA)    Difficulty of Paying Living Expenses: Not hard at all  Food Insecurity: No Food Insecurity (04/20/2023)   Hunger Vital Sign    Worried About Running Out of Food in the Last Year: Never true    Ran Out of Food in the Last Year: Never true  Transportation Needs: No Transportation Needs (04/20/2023)   PRAPARE - Administrator, Civil Service (Medical): No    Lack of Transportation (Non-Medical): No  Physical Activity: Sufficiently Active (04/20/2023)   Exercise Vital Sign    Days of Exercise per Week: 7 days    Minutes of Exercise per Session: 30 min  Stress: No Stress Concern Present (04/20/2023)   [place]  [employer] Occupational [employer] - Occupational Stress Questionnaire    Feeling of Stress : Only a little  Social Connections: Unknown (04/20/2023)   Social Connection and Isolation Panel [NHANES]    Frequency of Communication with Friends and Family: More than three times a week    Frequency of Social Gatherings with Friends and Family: More than three times a week    Attends Religious Services: Patient declined    Database administrator or Organizations: No    Attends Engineer, structural: Not on file    Marital Status: Married  Catering manager Violence: Not on file    Past Surgical History:  Procedure Laterality Date   COLPOSCOPY  2004   WISDOM TOOTH EXTRACTION  2005    Family History  Problem Relation Age of Onset   Hyperlipidemia  Mother    Hypertension Mother    Hyperlipidemia Father    Hypertension Father    Prostate cancer Father    Kidney cancer Father    Depression Brother    Depression Maternal Grandmother    Diabetes Maternal Grandmother    Stroke Maternal Grandmother    COPD Maternal Grandfather    Diabetes Maternal Grandfather    Stroke Maternal Grandfather    Depression Paternal Grandmother    Breast cancer Paternal Grandmother 67   Arthritis Paternal Grandfather    Heart disease Paternal Grandfather    Hyperlipidemia Paternal Grandfather    Hypertension Paternal Grandfather    Kidney disease Paternal Grandfather    Lung cancer Paternal Grandfather     No Known Allergies  Current Outpatient Medications on File Prior to Visit  Medication Sig Dispense Refill   Azelaic Acid 15 % gel Apply 1 application  topically as directed. After skin is thoroughly washed and patted dry, gently but thoroughly massage a thin film of azelaic acid gel into the affected area one to two times daily, in the morning and evening. 50 g 11   bimatoprost (LATISSE) 0.03 % ophthalmic solution Place 1 application. into both eyes at bedtime. Place one drop on applicator and apply evenly along the skin of the upper eyelid at base of eyelashes once daily at bedtime; repeat procedure for second eye (use a clean applicator). 5 mL 12   Cholecalciferol (VITAMIN D3) 2000 units capsule Take by mouth.     clindamycin (CLEOCIN T) 1 % external solution Apply 1 Application topically as directed once to twice daily to affected areas on neck and face as needed for flares 60 mL 11   clobetasol ointment (TEMOVATE) 0.05 % Apply 1 application topically daily for 2-4 weeks as needed for symptoms 30 g 0   ketoconazole (NIZORAL) 2 % shampoo Apply to scalp and ears once daily as directed. 120 mL 11   levothyroxine (SYNTHROID) 112 MCG tablet Take 1 tablet (112 mcg total) by mouth every morning on an empty stomach with water only. No food or other  medications for 30 minutes. 90 tablet 2   meloxicam (MOBIC) 15 MG tablet Take 1 tablet (15 mg total) by mouth daily. 90 tablet 0   Multiple Vitamin (MULTI-VITAMINS) TABS Take by mouth.     Roflumilast, Antiseborrheic, (ZORYVE) 0.3 % FOAM Apply 1 Application topically daily. Apply to affected areas in and behind ears. 60 g 3   spironolactone (ALDACTONE) 100 MG tablet Take 1 tablet (100 mg total) by mouth daily. 30 tablet 5   SUMAtriptan (IMITREX) 50 MG tablet Take 1 tablet by mouth at migraine onset. May repeat in  2 hours if headache persists or recurs. 9 tablet 0   Tretinoin (ALTRENO) 0.05 % LOTN Apply 1 Application topically at bedtime. Qhs to face 45 g 11   No current facility-administered medications on file prior to visit.    BP 102/68 (BP Location: Left Arm, Patient Position: Sitting, Cuff Size: Large)   Pulse 78   Temp 98.3 F (36.8 C) (Oral)   Ht 5\' 3"  (1.6 m)   Wt 222 lb (100.7 kg)   SpO2 97%   BMI 39.33 kg/m  Objective:   Physical Exam Cardiovascular:     Rate and Rhythm: Normal rate and regular rhythm.  Pulmonary:     Effort: Pulmonary effort is normal.     Breath sounds: Normal breath sounds.  Musculoskeletal:     Cervical back: Neck supple.  Skin:    General: Skin is warm and dry.  Neurological:     Mental Status: She is alert and oriented to person, place, and time.  Psychiatric:        Mood and Affect: Mood normal.           Assessment & Plan:  Class 2 obesity due to excess calories without serious comorbidity with body mass index (BMI) of 39.0 to 39.9 in adult Assessment & Plan: A1C today of 5.2 today.  Agree to transition patient to Zepbound 10 mg weekly.  Commended her on her weight loss, encouraged to continue with healthy lifestyle changes.  Orders: -     POCT glycosylated hemoglobin (Hb A1C) -     Zepbound; Inject 10 mg into the skin once a week.  Dispense: 2 mL; Refill: 0  PCOS (polycystic ovarian syndrome) -     POCT glycosylated  hemoglobin (Hb A1C) -     Zepbound; Inject 10 mg into the skin once a week.  Dispense: 2 mL; Refill: 0  Hypothyroidism (acquired) -     Zepbound; Inject 10 mg into the skin once a week.  Dispense: 2 mL; Refill: 0        Doreene Nest, NP

## 2023-04-20 NOTE — Assessment & Plan Note (Signed)
Commended her on weight loss!  A1c today of 5.2.

## 2023-04-20 NOTE — Patient Instructions (Signed)
Start tirzepitide (Zepbound) 10 mg medication for weight loss.    Please notify me if there are any issues with the prescription.  It was a pleasure to see you today!

## 2023-04-20 NOTE — Assessment & Plan Note (Addendum)
A1C today of 5.2 today.  Agree to transition patient to Zepbound 10 mg weekly.  Commended her on her weight loss, encouraged to continue with healthy lifestyle changes.

## 2023-04-23 MED ORDER — TIRZEPATIDE 10 MG/0.5ML ~~LOC~~ SOAJ
10.0000 mg | SUBCUTANEOUS | 0 refills | Status: DC
  Filled 2023-04-23: qty 2, 28d supply, fill #0
  Filled 2023-05-17: qty 6, 84d supply, fill #0

## 2023-04-24 ENCOUNTER — Other Ambulatory Visit: Payer: Self-pay

## 2023-04-27 ENCOUNTER — Other Ambulatory Visit: Payer: Self-pay

## 2023-05-03 ENCOUNTER — Other Ambulatory Visit: Payer: Self-pay | Admitting: Orthopedic Surgery

## 2023-05-03 DIAGNOSIS — M25562 Pain in left knee: Secondary | ICD-10-CM

## 2023-05-11 NOTE — Telephone Encounter (Signed)
Erika Henry, can we check on PA for [facility]?

## 2023-05-14 NOTE — Telephone Encounter (Signed)
Please check on status of PA for [facility] and submit if not already done.   Thanks,  Lonia Blood, CMA

## 2023-05-17 ENCOUNTER — Other Ambulatory Visit: Payer: Self-pay

## 2023-05-17 NOTE — Telephone Encounter (Signed)
Can someone check on the PA for [facility]?  It was prescribed on 04/23/2023.

## 2023-05-21 ENCOUNTER — Telehealth: Payer: Self-pay

## 2023-05-21 ENCOUNTER — Other Ambulatory Visit (HOSPITAL_COMMUNITY): Payer: Self-pay

## 2023-05-21 NOTE — Telephone Encounter (Signed)
 Pharmacy Patient Advocate Encounter   Received notification from Patient Advice Request messages that prior authorization for Mounjaro (substituted Zepbound in test claim due to no evidence of pt being diabetic) is required/requested.   Insurance verification completed.   The patient is insured through  [facility]  .   Per test claim: Plan/benefit exclusion

## 2023-05-21 NOTE — Telephone Encounter (Signed)
 Thanks, Ayla. The patient was told by her [employer] that they would cover specifically Mounjaro. I will let her know that the Zepbound is not covered. She does not have any of those conditions under [facility].  Also, I believe she wanted this to be run under her husbands insurance. I will double check.

## 2023-05-21 NOTE — Telephone Encounter (Signed)
 Is pt diabetic? Erika Henry is only approved for use in diabetics. Ran a test claim for Zepbound (same active ingredient, but approved for weight loss) and it came back as "Product/service not covered - Plan/benefit exclusion"   [name] is covered by patient's plan for CV risk reduction, defined as evidence of any of the following: Myocardial infarction (MI), (2) Prior ischemic or hemorrhagic stroke, (3) Symptomatic peripheral arterial disease (PAD) as evidence by one of the following (a) Intermittent claudication with ankle-brachial index (ABI) less than 0.85 (at rest); (b) Peripheral arterial revascularization procedure; (c) Amputation due to atherosclerotic disease

## 2023-05-22 NOTE — Telephone Encounter (Signed)
 Was Zepbound checked under her [employer]? I can re-order if needed.

## 2023-05-25 ENCOUNTER — Other Ambulatory Visit (HOSPITAL_COMMUNITY): Payer: Self-pay

## 2023-05-25 NOTE — Telephone Encounter (Signed)
 Contacted patient, reviewed pharmacy teams message. Patient will try to reach out to insurance and see if there is a way around

## 2023-05-25 NOTE — Telephone Encounter (Signed)
 The test bill that produced the previous result was run through [employer]: [phone], PCN: [id number], this is the only insurance that the eligibility check is pulling up for her, can we get the BIN, PCN, Group, and Member ID from pt if different from previously stated BIN? Please and thank you!

## 2023-05-25 NOTE — Telephone Encounter (Signed)
 Erika Henry, please call patient and read her the comments from Ayla.

## 2023-07-25 ENCOUNTER — Other Ambulatory Visit: Payer: Self-pay

## 2023-10-12 ENCOUNTER — Encounter: Payer: Self-pay | Admitting: Primary Care

## 2023-10-12 ENCOUNTER — Ambulatory Visit: Admitting: Primary Care

## 2023-10-12 VITALS — BP 102/64 | HR 88 | Temp 97.9°F | Ht 63.0 in | Wt 195.0 lb

## 2023-10-12 DIAGNOSIS — E282 Polycystic ovarian syndrome: Secondary | ICD-10-CM | POA: Diagnosis not present

## 2023-10-12 DIAGNOSIS — E785 Hyperlipidemia, unspecified: Secondary | ICD-10-CM

## 2023-10-12 DIAGNOSIS — E039 Hypothyroidism, unspecified: Secondary | ICD-10-CM | POA: Diagnosis not present

## 2023-10-12 DIAGNOSIS — E66811 Obesity, class 1: Secondary | ICD-10-CM | POA: Diagnosis not present

## 2023-10-12 DIAGNOSIS — E6609 Other obesity due to excess calories: Secondary | ICD-10-CM | POA: Diagnosis not present

## 2023-10-12 DIAGNOSIS — Z6834 Body mass index (BMI) 34.0-34.9, adult: Secondary | ICD-10-CM | POA: Diagnosis not present

## 2023-10-12 LAB — LIPID PANEL
Cholesterol: 188 mg/dL (ref 0–200)
HDL: 56.7 mg/dL (ref >39.00)
LDL Cholesterol: 119 mg/dL — ABNORMAL HIGH (ref 0–99)
NonHDL: 131.69
Total CHOL/HDL Ratio: 3
Triglycerides: 65 mg/dL (ref 0.0–149.0)
VLDL: 13 mg/dL (ref 0.0–40.0)

## 2023-10-12 LAB — COMPREHENSIVE METABOLIC PANEL WITH GFR
ALT: 20 U/L (ref 0–35)
AST: 16 U/L (ref 0–37)
Albumin: 4 g/dL (ref 3.5–5.2)
Alkaline Phosphatase: 64 U/L (ref 39–117)
BUN: 11 mg/dL (ref 6–23)
CO2: 23 meq/L (ref 19–32)
Calcium: 9 mg/dL (ref 8.4–10.5)
Chloride: 106 meq/L (ref 96–112)
Creatinine, Ser: 0.81 mg/dL (ref 0.40–1.20)
GFR: 88.5 mL/min (ref >60.00)
Glucose, Bld: 87 mg/dL (ref 70–99)
Potassium: 4.2 meq/L (ref 3.5–5.1)
Sodium: 139 meq/L (ref 135–145)
Total Bilirubin: 0.4 mg/dL (ref 0.2–1.2)
Total Protein: 6.6 g/dL (ref 6.0–8.3)

## 2023-10-12 LAB — TSH: TSH: 0.24 u[IU]/mL — ABNORMAL LOW (ref 0.35–5.50)

## 2023-10-12 LAB — HEMOGLOBIN A1C: Hgb A1c MFr Bld: 5.4 % (ref 4.6–6.5)

## 2023-10-12 MED ORDER — SEMAGLUTIDE-WEIGHT MANAGEMENT 0.25 MG/0.5ML ~~LOC~~ SOAJ
0.2500 mg | SUBCUTANEOUS | 0 refills | Status: DC
Start: 2023-10-12 — End: 2023-10-18

## 2023-10-12 NOTE — Patient Instructions (Addendum)
 Stop by the lab prior to leaving today. I will notify you of your results once received.   Start semaglutide [facility]) for weight loss. Start by injecting 0.25 mg into the skin once weekly for 4 weeks, then increase to 0.5 mg once weekly thereafter.  Please notify me once you have used your last 0.25 mg pen so that I can send the 0.5 mg dose to your pharmacy.   It was a pleasure to see you today!

## 2023-10-12 NOTE — Progress Notes (Signed)
 Subjective:    Patient ID: Erika Henry, female    DOB: 1981-03-02, 43 y.o.   MRN: [id number]  HPI  Erika Henry is a very pleasant 43 y.o. female with a history of migraines, hypothyroidism, obesity, psoriasis, PCOS, GERD who presents today to discuss obesity.   Currently managed on compounded Ozempic tied at 10 mg once weekly.  Given her medical history of PCOS and hypothyroidism she would like to try to get insurance to cover. She's been managed on compounded tirzepitide since September 2024 and has lost 60 pounds since then. Prior to tirzepitide she was on semaglutide which caused nausea, constipation, sulfur burps.  She is wanting a coronary calcium score CT scan to see if she has CAD. If she has CAD then her insurance may cover [facility] or Zepbound . She has a family history of heart disease in both grandfathers. Her father and mother have hypertension. Her brother has diabetes and kidney disease.   She also wants to see if her insurance will cover Wegovy if she sent to [facility]. She was denied Zepbound  in January 2025 as they prefer Wegovy.   She is walking 89999 steps daily at work and on days off she walks 4 miles per day on weekends. She has also improved her diet by increasing water intake, limiting sugar intake, increased protein intake, limiting carbs.  Body mass index is 34.54 kg/m.  Wt Readings from Last 3 Encounters:  10/12/23 195 lb (88.5 kg)  04/20/23 222 lb (100.7 kg)  01/18/23 239 lb (108.4 kg)        Review of Systems  Respiratory:  Negative for shortness of breath.   Cardiovascular:  Negative for chest pain.  Gastrointestinal:  Negative for constipation and nausea.  Neurological:  Negative for dizziness.         Past Medical History:  Diagnosis Date   ASCUS with positive high risk HPV cervical 2004   neg colpo bx 2/04   GERD (gastroesophageal reflux disease)    Hypothyroid    Lichen sclerosus    Migraines    PCOS (polycystic ovarian  syndrome)    Vaccine for human papilloma virus (HPV) types 6, 11, 16, and 18 administered     Social History   Socioeconomic History   Marital status: Married    Spouse name: Not on file   Number of children: Not on file   Years of education: Not on file   Highest education level: Associate degree: academic program  Occupational History   Not on file  Tobacco Use   Smoking status: Never   Smokeless tobacco: Never  Vaping Use   Vaping status: Never Used  Substance and Sexual Activity   Alcohol use: Yes    Comment: ocassional   Drug use: Not Currently   Sexual activity: Yes    Birth control/protection: None, Condom  Other Topics Concern   Not on file  Social History Narrative   Married.   No children.   Works as a Clinical biochemist at [employer].   Enjoys exercising, shopping, bowling, traveling.   Social Drivers of Corporate investment banker Strain: Low Risk  (05/03/2023)   Received [facility]   Overall Financial Resource Strain (CARDIA)    Difficulty of Paying Living Expenses: Not hard at all  Food Insecurity: No Food Insecurity (05/03/2023)   Received from [facility]   Hunger Vital Sign    Within the past 12 months, you worried that  your food would run out before you got the money to buy more.: Never true    Within the past 12 months, the food you bought just didn't last and you didn't have money to get more.: Never true  Transportation Needs: No Transportation Needs (05/03/2023)   Received from [facility] - Transportation    In the past 12 months, has lack of transportation kept you from medical appointments or from getting medications?: No    Lack of Transportation (Non-Medical): No  Physical Activity: Sufficiently Active (04/20/2023)   Exercise Vital Sign    Days of Exercise per Week: 7 days    Minutes of Exercise per Session: 30 min  Stress: No Stress Concern Present (04/20/2023)   [employer]ational Health - Occupational Stress Questionnaire    Feeling of Stress : Only a little  Social Connections: Unknown (04/20/2023)   Social Connection and Isolation Panel    Frequency of Communication with Friends and Family: More than three times a week    Frequency of Social Gatherings with Friends and Family: More than three times a week    Attends Religious Services: Patient declined    Database administrator or Organizations: No    Attends Engineer, structural: Not on file    Marital Status: Married  Catering manager Violence: Not on file    Past Surgical History:  Procedure Laterality Date   COLPOSCOPY  2004   WISDOM TOOTH EXTRACTION  2005    Family History  Problem Relation Age of Onset   Hyperlipidemia Mother    Hypertension Mother    Hyperlipidemia Father    Hypertension Father    Prostate cancer Father    Kidney cancer Father    Depression Brother    Depression Maternal Grandmother    Diabetes Maternal Grandmother    Stroke Maternal Grandmother    COPD Maternal Grandfather    Diabetes Maternal Grandfather    Stroke Maternal Grandfather    Depression Paternal Grandmother    Breast cancer Paternal Grandmother 28   Arthritis Paternal Grandfather    Heart disease Paternal Grandfather    Hyperlipidemia Paternal Grandfather    Hypertension Paternal Grandfather    Kidney disease Paternal Grandfather    Lung cancer Paternal Grandfather     No Known Allergies  Current Outpatient Medications on File Prior to Visit  Medication Sig Dispense Refill   Azelaic Acid  15 % gel Apply 1 application  topically as directed. After skin is thoroughly washed and patted dry, gently but thoroughly massage a thin film of azelaic acid  gel into the affected area one to two times daily, in the morning and evening. 50 g 11   bimatoprost  (LATISSE ) 0.03 % ophthalmic solution Place 1 application. into both eyes at bedtime. Place one drop on applicator and apply evenly  along the skin of the upper eyelid at base of eyelashes once daily at bedtime; repeat procedure for second eye (use a clean applicator). 5 mL 12   Cholecalciferol (VITAMIN D3) 2000 units capsule Take by mouth.     clindamycin  (CLEOCIN  T) 1 % external solution Apply 1 Application topically as directed once to twice daily to affected areas on neck and face as needed for flares 60 mL 11   clobetasol  ointment (TEMOVATE ) 0.05 % Apply 1 application topically daily for 2-4 weeks as needed for symptoms 30 g 0   ketoconazole  (NIZORAL ) 2 % shampoo Apply to scalp and ears once daily as  directed. 120 mL 11   levothyroxine  (SYNTHROID ) 112 MCG tablet Take 1 tablet (112 mcg total) by mouth every morning on an empty stomach with water only. No food or other medications for 30 minutes. 90 tablet 2   Multiple Vitamin (MULTI-VITAMINS) TABS Take by mouth.     Roflumilast , Antiseborrheic, (ZORYVE ) 0.3 % FOAM Apply 1 Application topically daily. Apply to affected areas in and behind ears. 60 g 3   spironolactone  (ALDACTONE ) 100 MG tablet Take 1 tablet (100 mg total) by mouth daily. 30 tablet 5   SUMAtriptan  (IMITREX ) 50 MG tablet Take 1 tablet by mouth at migraine onset. May repeat in 2 hours if headache persists or recurs. 9 tablet 0   tirzepatide  (MOUNJARO ) 10 MG/0.5ML Pen Inject 10 mg into the skin once a week. 6 mL 0   Tretinoin  (ALTRENO ) 0.05 % LOTN Apply 1 Application topically at bedtime. Qhs to face 45 g 11   meloxicam  (MOBIC ) 15 MG tablet Take 1 tablet (15 mg total) by mouth daily. (Patient not taking: Reported on 10/12/2023) 90 tablet 0   No current facility-administered medications on file prior to visit.    BP 102/64   Pulse 88   Temp 97.9 F (36.6 C) (Temporal)   Ht 5' 3 (1.6 m)   Wt 195 lb (88.5 kg)   LMP 09/30/2023   SpO2 95%   BMI 34.54 kg/m  Objective:   Physical Exam Cardiovascular:     Rate and Rhythm: Normal rate and regular rhythm.  Pulmonary:     Effort: Pulmonary effort is normal.      Breath sounds: Normal breath sounds.  Musculoskeletal:     Cervical back: Neck supple.  Skin:    General: Skin is warm and dry.  Neurological:     Mental Status: She is alert and oriented to person, place, and time.  Psychiatric:        Mood and Affect: Mood normal.           Assessment & Plan:  Class 1 obesity due to excess calories without serious comorbidity with body mass index (BMI) of 34.0 to 34.9 in adult Assessment & Plan: Commended her on exercise and a healthier diet!  Continue daily walking.   Rx for [facility] sent to [facility] per patient request.  CT coronary calcium score ordered and pending.  Orders: -     Semaglutide-Weight Management; Inject 0.25 mg into the skin once a week.  Dispense: 2 mL; Refill: 0 -     CT CARDIAC SCORING (SELF PAY ONLY); Future  Hypothyroidism (acquired) -     CT CARDIAC SCORING (SELF PAY ONLY); Future -     TSH  PCOS (polycystic ovarian syndrome) -     CT CARDIAC SCORING (SELF PAY ONLY); Future -     Hemoglobin A1c  Hyperlipidemia, unspecified hyperlipidemia type -     CT CARDIAC SCORING (SELF PAY ONLY); Future -     Lipid panel -     Comprehensive metabolic panel with GFR        Comer MARLA Gaskins, NP

## 2023-10-12 NOTE — Assessment & Plan Note (Signed)
 Commended her on exercise and a healthier diet!  Continue daily walking.   Rx for [facility] sent to [facility] per patient request.  CT coronary calcium score ordered and pending.

## 2023-10-14 ENCOUNTER — Ambulatory Visit: Payer: Self-pay | Admitting: Primary Care

## 2023-10-14 DIAGNOSIS — E785 Hyperlipidemia, unspecified: Secondary | ICD-10-CM

## 2023-10-14 DIAGNOSIS — I2584 Coronary atherosclerosis due to calcified coronary lesion: Secondary | ICD-10-CM

## 2023-10-14 DIAGNOSIS — E6609 Other obesity due to excess calories: Secondary | ICD-10-CM

## 2023-10-17 ENCOUNTER — Telehealth: Payer: Self-pay

## 2023-10-17 DIAGNOSIS — E66811 Obesity, class 1: Secondary | ICD-10-CM

## 2023-10-17 NOTE — Telephone Encounter (Signed)
 Copied from CRM [id number]. Topic: General - Other >> Oct 17, 2023  1:03 PM Aisha D wrote: Reason for CRM: Samantha with [facility] stated that she has been trying to reach the pt in regards to the Semaglutide -Weight Management 0.25 MG/0.5ML SOAJ. Lucie stated that when she calls the pt it goes to a dial tone. Lucie would like for the office to call or send a message to the pt to have her [facility] to [facility]. [facility] number is [phone], reference number is [id number].

## 2023-10-18 MED ORDER — SEMAGLUTIDE-WEIGHT MANAGEMENT 0.25 MG/0.5ML ~~LOC~~ SOAJ: 0.2500 mg | SUBCUTANEOUS | 0 refills | Status: DC

## 2023-10-18 NOTE — Telephone Encounter (Signed)
Prescription sent to [facility] 

## 2023-10-18 NOTE — Addendum Note (Signed)
 Addended by: Brilee Port K on: 10/18/2023 03:48 PM   Modules accepted: Orders

## 2023-10-19 ENCOUNTER — Telehealth: Payer: Self-pay

## 2023-10-19 ENCOUNTER — Other Ambulatory Visit (HOSPITAL_COMMUNITY): Payer: Self-pay

## 2023-10-19 NOTE — Telephone Encounter (Signed)
 Highest A1C 5.4 10/12/23. See encounter 05/21/23  Ozempic /Mounjaro  is approved exclusively as an adjunct to diet and exercise to improve glycemic  control in adults with type 2 diabetes mellitus. A review of patient's medical chart reveals no  documented diagnosis of type 2 diabetes or an A1C indicative of diabetes. Therefore, they do not  currently meet the criteria for prior authorization of this medication. If clinically appropriate, alternative  options such as Saxenda , Zepbound , or Wegovy  may be considered for this patient.

## 2023-10-19 NOTE — Telephone Encounter (Signed)
 Kelli, will you notify patient that Wegovy  was not covered by insurance.

## 2023-10-19 NOTE — Telephone Encounter (Signed)
 Hello!  The order placed on 10/18/23 was for Wegovy . Please take another look!  Mounjaro  is an old prescription written in January. Thanks!

## 2023-10-19 NOTE — Telephone Encounter (Signed)
 Pharmacy Patient Advocate Encounter   Received notification from Physician's Office that prior authorization for Wegovy  0.25 is required/requested.   Insurance verification completed.   The patient is insured through [facility]ICA[facility] .   Per test claim: patient has plan benefit exclusion for weight loss medications. PA only for cardiovascular reduction.

## 2023-10-24 ENCOUNTER — Encounter: Payer: Self-pay | Admitting: *Deleted

## 2023-10-29 ENCOUNTER — Other Ambulatory Visit: Payer: Self-pay | Admitting: Primary Care

## 2023-10-29 DIAGNOSIS — E039 Hypothyroidism, unspecified: Secondary | ICD-10-CM

## 2023-10-30 ENCOUNTER — Other Ambulatory Visit: Payer: Self-pay

## 2023-10-30 MED ORDER — LEVOTHYROXINE SODIUM 112 MCG PO TABS
112.0000 ug | ORAL_TABLET | Freq: Every morning | ORAL | 0 refills | Status: DC
  Filled 2023-10-30: qty 90, 90d supply, fill #0

## 2023-12-06 ENCOUNTER — Encounter: Admitting: Primary Care

## 2023-12-07 ENCOUNTER — Encounter: Payer: Commercial Managed Care - PPO | Admitting: Primary Care

## 2023-12-11 ENCOUNTER — Other Ambulatory Visit: Payer: Self-pay

## 2023-12-11 ENCOUNTER — Encounter: Payer: Self-pay | Admitting: General Practice

## 2023-12-11 ENCOUNTER — Ambulatory Visit: Payer: Self-pay | Admitting: General Practice

## 2023-12-11 ENCOUNTER — Ambulatory Visit: Admitting: General Practice

## 2023-12-11 VITALS — BP 124/80 | HR 94 | Temp 98.5°F | Ht 63.0 in | Wt 194.8 lb

## 2023-12-11 DIAGNOSIS — J029 Acute pharyngitis, unspecified: Secondary | ICD-10-CM | POA: Diagnosis not present

## 2023-12-11 DIAGNOSIS — J069 Acute upper respiratory infection, unspecified: Secondary | ICD-10-CM

## 2023-12-11 LAB — POCT INFLUENZA A/B
Influenza A, POC: NEGATIVE
Influenza B, POC: NEGATIVE

## 2023-12-11 LAB — POC COVID19 BINAXNOW: SARS Coronavirus 2 Ag: NEGATIVE

## 2023-12-11 MED ORDER — FLUTICASONE PROPIONATE 50 MCG/ACT NA SUSP
2.0000 | Freq: Every day | NASAL | 0 refills | Status: AC
  Filled 2023-12-11: qty 16, 30d supply, fill #0

## 2023-12-11 NOTE — Patient Instructions (Addendum)
 You can try a few things over the counter to help with your symptoms including:  Cough: Delsym  or Robitussin (get the off brand, works just as well) Chest Congestion: Mucinex  (plain) Nasal Congestion/Ear Pressure/Sinus Pressure: Try using Flonase  (fluticasone ) nasal spray. Instill 1 spray in each nostril twice daily. This can be purchased over the counter. Body aches, fevers, headache: Ibuprofen  (not to exceed 2400 mg in 24 hours) or Acetaminophen -Tylenol  (not to exceed 3000 mg in 24 hours) Runny Nose/Throat Drainage/Sneezing/Itchy or Watery Eyes: An antihistamine such as Zyrtec, Claritin, Xyzal, Allegra  You should be feeling better by day seven of symptoms, but please do schedule an appointment if this is not the case.  I encourage you to rest, increase fluid intake and use a humidifier.   It was a pleasure meeting you!

## 2023-12-11 NOTE — Progress Notes (Signed)
 Established Patient Office Visit  Subjective   Patient ID: Erika Henry, female    DOB: February 24, 1981  Age: 43 y.o. MRN: [id number]  Chief Complaint  Patient presents with   Sore Throat    With sinus drainage and congestion; sore throat has gotten better. Also has fever and body aches. Sx started Sunday. Has been taking nyquil and dayquil for sx.     Sore Throat  Associated symptoms include congestion. Pertinent negatives include no abdominal pain, coughing, diarrhea, ear pain, headaches, shortness of breath or vomiting.    Erika Henry is a 43 year old female, patient of Comer Gaskins, NP, with past medical history of migraines, vertigo, obesity, GERD, presents today for an acute visit.   Discussed the use of AI scribe software for clinical note transcription with the patient, who gave verbal consent to proceed.  History of Present Illness Erika Henry is a 43 year old female who presents with sinus drainage, fever, and body aches.  Symptoms began on Sunday with sinus drainage, sore throat, fever, and body aches. As of today, there is improvement in the sore throat, but she continues to experience fever, chills, body aches, congestion, and a runny nose. The sinus symptoms are the most bothersome.  Her symptoms started after her husband's, and she initially attributed them to fatigue from traveling.  No ear pain, chest pain, shortness of breath, significant cough, nausea, vomiting, diarrhea, constipation, urinary symptoms, dizziness, or headaches. She has a slight cough but is not producing any sputum.  For symptom management, she has been using Nyquil and DayQuil, which provide some relief. Nyquil helps her sleep at night, while DayQuil alleviates symptoms during the day, although she experiences nasal congestion when lying down.  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   Patient Active Problem List   Diagnosis Date Noted    Migraines 11/18/2021   Elevated liver function tests 05/06/2021   Class 1 obesity due to excess calories with body mass index (BMI) of 34.0 to 34.9 in adult 04/22/2021   Preventative health care 10/11/2018   Vertigo 03/26/2018   PCOS (polycystic ovarian syndrome) 02/09/2017   Psoriasis 02/09/2017   Hypothyroidism (acquired) 05/24/2016   Vitamin D  deficiency disease 05/24/2016   Gastro-esophageal reflux disease without esophagitis 08/02/2015   Past Medical History:  Diagnosis Date   ASCUS with positive high risk HPV cervical 2004   neg colpo bx 2/04   GERD (gastroesophageal reflux disease)    Hypothyroid    Lichen sclerosus    Migraines    PCOS (polycystic ovarian syndrome)    Vaccine for human papilloma virus (HPV) types 6, 11, 16, and 18 administered    Past Surgical History:  Procedure Laterality Date   COLPOSCOPY  2004   WISDOM TOOTH EXTRACTION  2005   Social History   Tobacco Use   Smoking status: Never   Smokeless tobacco: Never  Vaping Use   Vaping status: Never Used  Substance Use Topics   Alcohol use: Yes    Comment: ocassional   Drug use: Not Currently   No Known Allergies       12/11/2023   12:10 PM 10/12/2023   11:12 AM 08/04/2022    7:27 AM  Depression screen PHQ 2/9  Decreased Interest 0 0 0  Down, Depressed, Hopeless 0 0 0  PHQ - 2 Score 0 0 0  Altered sleeping 0    Tired, decreased energy 0  Change in appetite 0    Feeling bad or failure about yourself  0    Trouble concentrating 0    Moving slowly or fidgety/restless 0    Suicidal thoughts 0    PHQ-9 Score 0    Difficult doing work/chores Not difficult at all         12/11/2023   12:10 PM 11/18/2021    9:06 AM  GAD 7 : Generalized Anxiety Score  Nervous, Anxious, on Edge 0 0  Control/stop worrying 0 0  Worry too much - different things 0 0  Trouble relaxing 0 0  Restless 0 0  Easily annoyed or irritable 0 0  Afraid - awful might happen 0 0  Total GAD 7 Score 0 0  Anxiety  Difficulty Not difficult at all Not difficult at all      Review of Systems  Constitutional:  Positive for chills, fever and malaise/fatigue.  HENT:  Positive for congestion, sinus pain and sore throat. Negative for ear pain.   Respiratory:  Negative for cough and shortness of breath.   Cardiovascular:  Negative for chest pain.  Gastrointestinal:  Negative for abdominal pain, constipation, diarrhea, heartburn, nausea and vomiting.  Genitourinary:  Negative for dysuria, frequency and urgency.  Neurological:  Negative for dizziness and headaches.  Endo/Heme/Allergies:  Negative for polydipsia.  Psychiatric/Behavioral:  Negative for depression and suicidal ideas. The patient is not nervous/anxious.       Objective:     BP 124/80   Pulse 94   Temp 98.5 F (36.9 C) (Temporal)   Ht 5' 3 (1.6 m)   Wt 194 lb 12.8 oz (88.4 kg)   SpO2 97%   BMI 34.51 kg/m  BP Readings from Last 3 Encounters:  12/11/23 124/80  10/12/23 102/64  04/20/23 102/68   Wt Readings from Last 3 Encounters:  12/11/23 194 lb 12.8 oz (88.4 kg)  10/12/23 195 lb (88.5 kg)  04/20/23 222 lb (100.7 kg)      Physical Exam Vitals and nursing note reviewed.  Constitutional:      Appearance: Normal appearance.  HENT:     Right Ear: Tympanic membrane and ear canal normal.     Left Ear: Tympanic membrane and ear canal normal.     Nose: Congestion present.     Mouth/Throat:     Mouth: Mucous membranes are moist.     Pharynx: Oropharynx is clear.  Eyes:     Conjunctiva/sclera: Conjunctivae normal.  Cardiovascular:     Rate and Rhythm: Normal rate and regular rhythm.     Pulses: Normal pulses.     Heart sounds: Normal heart sounds.  Pulmonary:     Effort: Pulmonary effort is normal.     Breath sounds: Normal breath sounds.  Neurological:     Mental Status: She is alert and oriented to person, place, and time.  Psychiatric:        Mood and Affect: Mood normal.        Behavior: Behavior normal.         Thought Content: Thought content normal.        Judgment: Judgment normal.      Results for orders placed or performed in visit on 12/11/23  POC COVID-19  Result Value Ref Range   SARS Coronavirus 2 Ag Negative Negative  POCT Influenza A/B  Result Value Ref Range   Influenza A, POC Negative Negative   Influenza B, POC Negative Negative       The 10-year ASCVD  risk score (Arnett DK, et al., 2019) is: 0.6%    Assessment & Plan:  Viral URI -     Fluticasone  Propionate; Place 2 sprays into both nostrils daily.  Dispense: 16 g; Refill: 0  Sore throat -     POC COVID-19 BinaxNow -     POCT Influenza A/B    Assessment and Plan Assessment & Plan Acute viral upper respiratory infection Symptoms consistent with acute viral URI. COVID-19 and influenza tests negative. No evidence of bacterial infection or complications.  - Prescribed Flonase , one spray each nostril twice daily. - Recommended OTC medications for symptom relief, including Zyrtec or any daily antihistamine. - Encouraged rest, increased fluid intake, and humidifier use. - Advised to monitor symptoms and contact via MyChart if symptoms worsen or new symptoms develop. - Provided work absence note, return on Thursday.   Return if symptoms worsen or fail to improve.    Carrol Aurora, NP

## 2023-12-14 ENCOUNTER — Other Ambulatory Visit: Payer: Self-pay

## 2023-12-14 ENCOUNTER — Ambulatory Visit: Payer: Self-pay | Admitting: Primary Care

## 2023-12-14 ENCOUNTER — Ambulatory Visit: Admitting: Primary Care

## 2023-12-14 ENCOUNTER — Encounter: Payer: Self-pay | Admitting: Primary Care

## 2023-12-14 VITALS — BP 116/78 | HR 88 | Temp 98.1°F | Ht 63.0 in | Wt 190.0 lb

## 2023-12-14 DIAGNOSIS — E6609 Other obesity due to excess calories: Secondary | ICD-10-CM

## 2023-12-14 DIAGNOSIS — L409 Psoriasis, unspecified: Secondary | ICD-10-CM

## 2023-12-14 DIAGNOSIS — Z6833 Body mass index (BMI) 33.0-33.9, adult: Secondary | ICD-10-CM

## 2023-12-14 DIAGNOSIS — E66811 Obesity, class 1: Secondary | ICD-10-CM

## 2023-12-14 DIAGNOSIS — R051 Acute cough: Secondary | ICD-10-CM

## 2023-12-14 DIAGNOSIS — Z1231 Encounter for screening mammogram for malignant neoplasm of breast: Secondary | ICD-10-CM | POA: Diagnosis not present

## 2023-12-14 DIAGNOSIS — Z0000 Encounter for general adult medical examination without abnormal findings: Secondary | ICD-10-CM | POA: Diagnosis not present

## 2023-12-14 DIAGNOSIS — G43009 Migraine without aura, not intractable, without status migrainosus: Secondary | ICD-10-CM | POA: Diagnosis not present

## 2023-12-14 DIAGNOSIS — K219 Gastro-esophageal reflux disease without esophagitis: Secondary | ICD-10-CM

## 2023-12-14 DIAGNOSIS — E039 Hypothyroidism, unspecified: Secondary | ICD-10-CM

## 2023-12-14 LAB — TSH: TSH: 2.39 u[IU]/mL (ref 0.35–5.50)

## 2023-12-14 MED ORDER — MUCINEX DM MAXIMUM STRENGTH 60-1200 MG PO TB12
1.0000 | ORAL_TABLET | Freq: Two times a day (BID) | ORAL | 0 refills | Status: AC | PRN
  Filled 2023-12-14: qty 14, 7d supply, fill #0

## 2023-12-14 NOTE — Assessment & Plan Note (Signed)
 She is taking levothyroxine  correctly  Continue levothyroxine  112 mcg dose for now, may need to decrease dose.   Repeat TSH pending.

## 2023-12-14 NOTE — Assessment & Plan Note (Signed)
 Controlled.  Continue to monitor.

## 2023-12-14 NOTE — Assessment & Plan Note (Signed)
 Controlled.  Follow with dermatology.  Continue regimen as prescribed.

## 2023-12-14 NOTE — Assessment & Plan Note (Signed)
Controlled.  Continue sumatriptan 50 mg PRN.  

## 2023-12-14 NOTE — Assessment & Plan Note (Addendum)
 Immunizations UTD. Declines influenza vaccine.  Pap smear UTD. Mammogram due, orders placed.  Discussed the importance of a healthy diet and regular exercise in order for weight loss, and to reduce the risk of further co-morbidity.  Exam stable. Labs pending and reviewed  Follow up in 1 year for repeat physical.

## 2023-12-14 NOTE — Progress Notes (Signed)
 Subjective:    Patient ID: Erika Henry, female    DOB: September 15, 1980, 43 y.o.   MRN: [id number]  Erika Henry is a very pleasant 43 y.o. female with a history of migraines, hypothyroidism, PCOS, psoriasis, obesity who presents today for complete physical and follow up of chronic conditions.  Immunizations: -Tetanus: Completed in 2023 -Influenza: Declines influenza vaccine. Will get at work.   Diet: Fair diet.  Exercise: No regular exercise.  Eye exam: Completes annually  Dental exam: Completes semi-annually    Pap Smear: Completed in October 2022, follows with GYN.  Mammogram: Completed in September 2024  BP Readings from Last 3 Encounters:  12/14/23 116/78  12/11/23 124/80  10/12/23 102/64    Wt Readings from Last 3 Encounters:  12/14/23 190 lb (86.2 kg)  12/11/23 194 lb 12.8 oz (88.4 kg)  10/12/23 195 lb (88.5 kg)   Body mass index is 33.66 kg/m.    Review of Systems  Constitutional:  Negative for unexpected weight change.  HENT:  Positive for congestion. Negative for rhinorrhea.   Respiratory:  Positive for cough. Negative for shortness of breath.        Getting over a cold from earlier this week, feeling better  Cardiovascular:  Negative for chest pain.  Gastrointestinal:  Negative for constipation and diarrhea.  Genitourinary:  Negative for difficulty urinating.  Musculoskeletal:  Negative for arthralgias and myalgias.  Skin:  Negative for rash.  Allergic/Immunologic: Negative for environmental allergies.  Neurological:  Negative for dizziness and headaches.  Psychiatric/Behavioral:  The patient is not nervous/anxious.          Past Medical History:  Diagnosis Date   ASCUS with positive high risk HPV cervical 2004   neg colpo bx 2/04   GERD (gastroesophageal reflux disease)    Hypothyroid    Lichen sclerosus    Migraines    PCOS (polycystic ovarian syndrome)    Vaccine for human papilloma virus (HPV) types 6, 11, 16, and 18 administered      Social History   Socioeconomic History   Marital status: Married    Spouse name: Not on file   Number of children: Not on file   Years of education: Not on file   Highest education level: Associate degree: academic program  Occupational History   Not on file  Tobacco Use   Smoking status: Never   Smokeless tobacco: Never  Vaping Use   Vaping status: Never Used  Substance and Sexual Activity   Alcohol use: Yes    Comment: ocassional   Drug use: Not Currently   Sexual activity: Yes    Birth control/protection: None, Condom  Other Topics Concern   Not on file  Social History Narrative   Married.   No children.   Works as a Clinical biochemist at [employer].   Enjoys exercising, shopping, bowling, traveling.   Social Drivers of Corporate investment banker Strain: Low Risk  (05/03/2023)   Received from [facility]   Overall Financial Resource Strain (CARDIA)    Difficulty of Paying Living Expenses: Not hard at all  Food Insecurity: No Food Insecurity (05/03/2023)   Received from [facility]   Hunger Vital Sign    Within the past 12 months, you worried that your food would run out before you got the money to buy more.: Never true    Within the past 12 months, the food you bought just didn't last and you didn't have money to  get more.: Never true  Transportation Needs: No Transportation Needs (05/03/2023)   Received from [facility] - Transportation    In the past 12 months, has lack of transportation kept you from medical appointments or from getting medications?: No    Lack of Transportation (Non-Medical): No  Physical Activity: Sufficiently Active (04/20/2023)   Exercise Vital Sign    Days of Exercise per Week: 7 days    Minutes of Exercise per Session: 30 min  Stress: No Stress Concern Present (04/20/2023)   [employer] of Occupational Health - Occupational Stress Questionnaire    Feeling of Stress : Only a  little  Social Connections: Unknown (04/20/2023)   Social Connection and Isolation Panel    Frequency of Communication with Friends and Family: More than three times a week    Frequency of Social Gatherings with Friends and Family: More than three times a week    Attends Religious Services: Patient declined    Database administrator or Organizations: No    Attends Engineer, structural: Not on file    Marital Status: Married  Catering manager Violence: Not on file    Past Surgical History:  Procedure Laterality Date   COLPOSCOPY  2004   WISDOM TOOTH EXTRACTION  2005    Family History  Problem Relation Age of Onset   Hyperlipidemia Mother    Hypertension Mother    Hyperlipidemia Father    Hypertension Father    Prostate cancer Father    Kidney cancer Father    Depression Brother    Depression Maternal Grandmother    Diabetes Maternal Grandmother    Stroke Maternal Grandmother    COPD Maternal Grandfather    Diabetes Maternal Grandfather    Stroke Maternal Grandfather    Depression Paternal Grandmother    Breast cancer Paternal Grandmother 53   Arthritis Paternal Grandfather    Heart disease Paternal Grandfather    Hyperlipidemia Paternal Grandfather    Hypertension Paternal Grandfather    Kidney disease Paternal Grandfather    Lung cancer Paternal Grandfather     No Known Allergies  Current Outpatient Medications on File Prior to Visit  Medication Sig Dispense Refill   Azelaic Acid  15 % gel Apply 1 application  topically as directed. After skin is thoroughly washed and patted dry, gently but thoroughly massage a thin film of azelaic acid  gel into the affected area one to two times daily, in the morning and evening. 50 g 11   bimatoprost  (LATISSE ) 0.03 % ophthalmic solution Place 1 application. into both eyes at bedtime. Place one drop on applicator and apply evenly along the skin of the upper eyelid at base of eyelashes once daily at bedtime; repeat procedure for  second eye (use a clean applicator). 5 mL 12   Cholecalciferol (VITAMIN D3) 2000 units capsule Take by mouth.     clindamycin  (CLEOCIN  T) 1 % external solution Apply 1 Application topically as directed once to twice daily to affected areas on neck and face as needed for flares 60 mL 11   clobetasol  ointment (TEMOVATE ) 0.05 % Apply 1 application topically daily for 2-4 weeks as needed for symptoms 30 g 0   fluticasone  (FLONASE ) 50 MCG/ACT nasal spray Place 2 sprays into both nostrils daily. 16 g 0   ketoconazole  (NIZORAL ) 2 % shampoo Apply to scalp and ears once daily as directed. 120 mL 11   levothyroxine  (SYNTHROID ) 112 MCG tablet Take 1 tablet (112 mcg total) by  mouth every morning on an empty stomach with water only. No food or other medications for 30 minutes. 90 tablet 0   Multiple Vitamin (MULTI-VITAMINS) TABS Take by mouth.     Roflumilast , Antiseborrheic, (ZORYVE ) 0.3 % FOAM Apply 1 Application topically daily. Apply to affected areas in and behind ears. 60 g 3   spironolactone  (ALDACTONE ) 100 MG tablet Take 1 tablet (100 mg total) by mouth daily. 30 tablet 5   SUMAtriptan  (IMITREX ) 50 MG tablet Take 1 tablet by mouth at migraine onset. May repeat in 2 hours if headache persists or recurs. 9 tablet 0   tirzepatide  (MOUNJARO ) 10 MG/0.5ML Pen Inject 10 mg into the skin once a week. 6 mL 0   Tretinoin  (ALTRENO ) 0.05 % LOTN Apply 1 Application topically at bedtime. Qhs to face 45 g 11   No current facility-administered medications on file prior to visit.    BP 116/78   Pulse 88   Temp 98.1 F (36.7 C) (Temporal)   Ht 5' 3 (1.6 m)   Wt 190 lb (86.2 kg)   LMP 11/25/2023   SpO2 96%   BMI 33.66 kg/m  Objective:   Physical Exam HENT:     Right Ear: Tympanic membrane and ear canal normal.     Left Ear: Tympanic membrane and ear canal normal.  Eyes:     Pupils: Pupils are equal, round, and reactive to light.  Cardiovascular:     Rate and Rhythm: Normal rate and regular rhythm.   Pulmonary:     Effort: Pulmonary effort is normal.     Breath sounds: Normal breath sounds.  Abdominal:     General: Bowel sounds are normal.     Palpations: Abdomen is soft.     Tenderness: There is no abdominal tenderness.  Musculoskeletal:        General: Normal range of motion.     Cervical back: Neck supple.  Skin:    General: Skin is warm and dry.  Neurological:     Mental Status: She is alert and oriented to person, place, and time.     Cranial Nerves: No cranial nerve deficit.     Deep Tendon Reflexes:     Reflex Scores:      Patellar reflexes are 2+ on the right side and 2+ on the left side. Psychiatric:        Mood and Affect: Mood normal.     Physical Exam        Assessment & Plan:  Preventative health care Assessment & Plan: Immunizations UTD. Declines influenza vaccine.  Pap smear UTD. Mammogram due, orders placed.  Discussed the importance of a healthy diet and regular exercise in order for weight loss, and to reduce the risk of further co-morbidity.  Exam stable. Labs pending and reviewed  Follow up in 1 year for repeat physical.    Screening mammogram for breast cancer -     3D Screening Mammogram, Left and Right; Future  Migraine without aura and without status migrainosus, not intractable Assessment & Plan: Controlled.  Continue sumatriptan  50 mg PRN   Gastro-esophageal reflux disease without esophagitis Assessment & Plan: Controlled.  Continue to monitor.    Hypothyroidism (acquired) Assessment & Plan: She is taking levothyroxine  correctly  Continue levothyroxine  112 mcg dose for now, may need to decrease dose.   Repeat TSH pending.  Orders: -     TSH  Acute cough -     Mucinex  DM Maximum Strength; Take 1 tablet by mouth 2 (  two) times daily as needed. For cough/congestion  Dispense: 28 tablet; Refill: 0  Class 1 obesity due to excess calories without serious comorbidity with body mass index (BMI) of 33.0 to 33.9 in  adult Assessment & Plan: Improving!  Will refer to healthy weight and wellness as her compounded tirzepatide  is cost prohibitive. Unfortunately, her employer drug formulary does not consist of either Wegovy  or Zepbound .  Referral placed.  Orders: -     Amb Ref to Medical Weight Management  Psoriasis Assessment & Plan: Controlled.  Follow with dermatology.  Continue regimen as prescribed.     Assessment and Plan Assessment & Plan         Comer MARLA Gaskins, NP    History of Present Illness

## 2023-12-14 NOTE — Assessment & Plan Note (Signed)
 Improving!  Will refer to healthy weight and wellness as her compounded tirzepatide  is cost prohibitive. Unfortunately, her employer drug formulary does not consist of either Wegovy  or Zepbound .  Referral placed.

## 2023-12-18 ENCOUNTER — Encounter: Payer: Self-pay | Admitting: General Practice

## 2023-12-18 ENCOUNTER — Other Ambulatory Visit: Payer: Self-pay

## 2023-12-18 DIAGNOSIS — R051 Acute cough: Secondary | ICD-10-CM

## 2023-12-18 MED ORDER — PROMETHAZINE-DM 6.25-15 MG/5ML PO SYRP
5.0000 mL | ORAL_SOLUTION | Freq: Four times a day (QID) | ORAL | 0 refills | Status: AC | PRN
  Filled 2023-12-18: qty 118, 6d supply, fill #0

## 2023-12-19 ENCOUNTER — Other Ambulatory Visit: Payer: Self-pay

## 2023-12-21 ENCOUNTER — Ambulatory Visit
Admission: RE | Admit: 2023-12-21 | Discharge: 2023-12-21 | Disposition: A | Discharge status: Home / Self Care | Payer: Self-pay | Source: Ambulatory Visit | Attending: Primary Care | Admitting: Primary Care

## 2023-12-21 DIAGNOSIS — E6609 Other obesity due to excess calories: Secondary | ICD-10-CM | POA: Insufficient documentation

## 2023-12-21 DIAGNOSIS — E039 Hypothyroidism, unspecified: Secondary | ICD-10-CM | POA: Insufficient documentation

## 2023-12-21 DIAGNOSIS — E785 Hyperlipidemia, unspecified: Secondary | ICD-10-CM | POA: Insufficient documentation

## 2023-12-21 DIAGNOSIS — Z6834 Body mass index (BMI) 34.0-34.9, adult: Secondary | ICD-10-CM | POA: Insufficient documentation

## 2023-12-21 DIAGNOSIS — E282 Polycystic ovarian syndrome: Secondary | ICD-10-CM | POA: Insufficient documentation

## 2023-12-21 DIAGNOSIS — E66811 Obesity, class 1: Secondary | ICD-10-CM | POA: Insufficient documentation

## 2023-12-23 ENCOUNTER — Other Ambulatory Visit: Payer: Self-pay

## 2023-12-23 MED ORDER — ZEPBOUND 10 MG/0.5ML ~~LOC~~ SOAJ
10.0000 mg | SUBCUTANEOUS | 0 refills | Status: DC
  Filled 2023-12-23: qty 2, 28d supply, fill #0

## 2023-12-23 NOTE — Addendum Note (Signed)
 Addended by: Kalasia Crafton K on: 12/23/2023 04:05 PM   Modules accepted: Orders

## 2023-12-24 ENCOUNTER — Other Ambulatory Visit (HOSPITAL_COMMUNITY): Payer: Self-pay

## 2023-12-24 ENCOUNTER — Telehealth: Payer: Self-pay

## 2023-12-24 ENCOUNTER — Other Ambulatory Visit: Payer: Self-pay

## 2023-12-24 NOTE — Telephone Encounter (Signed)
 Pharmacy Patient Advocate Encounter   Received notification from Onbase that prior authorization for Zepbound  10 is required/requested.   Insurance verification completed.   The patient is insured through [employer] .   Per test claim: Per test claim, medication is not covered due to plan/benefit exclusion, PA not submitted at this time Patient plan only covers for OSA, no OSA found in pt chart

## 2023-12-25 ENCOUNTER — Other Ambulatory Visit (HOSPITAL_COMMUNITY): Payer: Self-pay

## 2023-12-26 ENCOUNTER — Other Ambulatory Visit: Payer: Self-pay

## 2023-12-28 NOTE — Telephone Encounter (Signed)
 Pharmacy Patient Advocate Encounter   Received notification from Onbase that prior authorization for Zepbound  10 is required/requested.   Insurance verification completed.   The patient is insured through [employer] .   Per test claim: Per test claim, medication is not covered due to plan/benefit exclusion, PA not submitted at this time Patient plan only covers for OSA, no OSA found in pt chart

## 2023-12-30 ENCOUNTER — Other Ambulatory Visit: Payer: Self-pay

## 2023-12-30 MED ORDER — SEMAGLUTIDE-WEIGHT MANAGEMENT 0.25 MG/0.5ML ~~LOC~~ SOAJ
0.2500 mg | SUBCUTANEOUS | 0 refills | Status: AC
  Filled 2023-12-30 – 2024-01-03 (×2): qty 2, 28d supply, fill #0

## 2023-12-30 NOTE — Addendum Note (Signed)
 Addended by: Maryjane Benedict K on: 12/30/2023 02:29 PM   Modules accepted: Orders

## 2023-12-30 NOTE — Telephone Encounter (Signed)
 See other messages

## 2023-12-31 ENCOUNTER — Other Ambulatory Visit: Payer: Self-pay

## 2024-01-02 ENCOUNTER — Encounter (INDEPENDENT_AMBULATORY_CARE_PROVIDER_SITE_OTHER): Payer: Self-pay

## 2024-01-03 ENCOUNTER — Other Ambulatory Visit (HOSPITAL_COMMUNITY): Payer: Self-pay

## 2024-01-03 ENCOUNTER — Other Ambulatory Visit: Payer: Self-pay

## 2024-01-03 ENCOUNTER — Telehealth: Payer: Self-pay

## 2024-01-03 DIAGNOSIS — R0683 Snoring: Secondary | ICD-10-CM

## 2024-01-03 NOTE — Telephone Encounter (Signed)
 Pharmacy Patient Advocate Encounter   Received notification from Onbase that prior authorization for Wegovy  0.25 is required/requested.   Insurance verification completed.   The patient is insured through [facility].   Per test claim: Per test claim, medication is not covered due to plan/benefit exclusion, PA not submitted at this time. Patient plan covers Wegovy  for CVA. I did not see any in patient chart

## 2024-01-03 NOTE — Telephone Encounter (Signed)
Noted.  Will notify patient.

## 2024-01-04 NOTE — Addendum Note (Signed)
 Addended by: Brentyn Seehafer K on: 01/04/2024 12:42 PM   Modules accepted: Orders

## 2024-01-04 NOTE — Telephone Encounter (Signed)
 Moria, can you take a look at her message and help? I'm not sure what else to do.

## 2024-01-05 ENCOUNTER — Other Ambulatory Visit: Payer: Self-pay

## 2024-01-08 ENCOUNTER — Other Ambulatory Visit (HOSPITAL_COMMUNITY): Payer: Self-pay

## 2024-01-08 NOTE — Telephone Encounter (Signed)
Sending to PA team

## 2024-01-10 NOTE — Telephone Encounter (Signed)
 Can one of you guys help?

## 2024-01-11 ENCOUNTER — Ambulatory Visit
Admission: RE | Admit: 2024-01-11 | Discharge: 2024-01-11 | Disposition: A | Discharge status: Home / Self Care | Source: Ambulatory Visit | Attending: Primary Care | Admitting: Primary Care

## 2024-01-11 DIAGNOSIS — Z1231 Encounter for screening mammogram for malignant neoplasm of breast: Secondary | ICD-10-CM | POA: Insufficient documentation

## 2024-01-14 ENCOUNTER — Other Ambulatory Visit (HOSPITAL_COMMUNITY): Payer: Self-pay

## 2024-01-14 ENCOUNTER — Telehealth: Payer: Self-pay

## 2024-01-14 NOTE — Telephone Encounter (Signed)
 Did you see my other message?  I think we should submit the CT coronary calcium scan. Otherwise, I do not have documentation that meets these requirements.

## 2024-01-14 NOTE — Telephone Encounter (Signed)
 Used CT Coronary calcium score Pharmacy Patient Advocate Encounter   Received notification from Physician's Office that prior authorization for Wegovy  0.25 is required/requested.   Insurance verification completed.   The patient is insured through [facility].   Per test claim: PA required; PA submitted to above mentioned insurance via Latent Key/confirmation #/EOC [id number] Status is pending

## 2024-01-14 NOTE — Telephone Encounter (Signed)
 I (letter, not number) 25.84

## 2024-01-14 NOTE — Telephone Encounter (Signed)
 Prior Authorization form/request asks a question that requires your assistance. Please see the question below and advise accordingly. The PA will not be submitted until the necessary information is received.

## 2024-01-14 NOTE — Telephone Encounter (Signed)
 Moria, can you run PA? She needs it run so that she can appeal.

## 2024-01-15 NOTE — Telephone Encounter (Signed)
Noted.  Will notify patient.

## 2024-01-15 NOTE — Telephone Encounter (Signed)
 Pharmacy Patient Advocate Encounter  Received notification from [employer] that Prior Authorization for Wegovy  0.25 has been DENIED.  Full denial letter will be uploaded to the media tab. See denial reason below.    PA #/Case ID/Reference #: # [id number]

## 2024-01-17 ENCOUNTER — Other Ambulatory Visit: Payer: Self-pay | Admitting: Medical Genetics

## 2024-01-17 DIAGNOSIS — Z006 Encounter for examination for normal comparison and control in clinical research program: Secondary | ICD-10-CM

## 2024-02-08 ENCOUNTER — Ambulatory Visit: Admitting: Internal Medicine

## 2024-02-08 ENCOUNTER — Other Ambulatory Visit: Payer: Self-pay

## 2024-02-08 ENCOUNTER — Encounter: Payer: Self-pay | Admitting: Internal Medicine

## 2024-02-08 ENCOUNTER — Other Ambulatory Visit: Payer: Self-pay | Admitting: Primary Care

## 2024-02-08 DIAGNOSIS — E039 Hypothyroidism, unspecified: Secondary | ICD-10-CM

## 2024-02-08 MED ORDER — LEVOTHYROXINE SODIUM 112 MCG PO TABS
112.0000 ug | ORAL_TABLET | Freq: Every morning | ORAL | 2 refills | Status: AC
  Filled 2024-02-08: qty 90, 90d supply, fill #0

## 2024-02-08 NOTE — Progress Notes (Deleted)
 Name: Erika Henry MRN: [id number] DOB: [birth year]    CHIEF COMPLAINT:  ASSESSMENT OF SLEEP APNEA EXCESSIVE DAYTIME SLEEPINESS   HISTORY OF PRESENT ILLNESS: Patient is seen today for problems and issues with sleep related to excessive daytime sleepiness Patient  has been having sleep problems for many years Patient has been having excessive daytime sleepiness for a long time Patient has been having extreme fatigue and tiredness, lack of energy +  very Loud snoring every night + struggling breathe at night and gasps for air + morning headaches + Nonrefreshing sleep  Discussed sleep data and reviewed with patient.  Encouraged proper weight management.  Discussed driving precautions and its relationship with hypersomnolence.  Discussed operating dangerous equipment and its relationship with hypersomnolence.  Discussed sleep hygiene, and benefits of a fixed sleep waked time.  The importance of getting eight or more hours of sleep discussed with patient.  Discussed limiting the use of the computer and television before bedtime.  Decrease naps during the day, so night time sleep will become enhanced.  Limit caffeine, and sleep deprivation.  HTN, stroke, and heart failure are potential risk factors.    EPWORTH SLEEP SCORE***    PAST MEDICAL HISTORY :   has a past medical history of ASCUS with positive high risk HPV cervical (2004), GERD (gastroesophageal reflux disease), Hypothyroid, Lichen sclerosus, Migraines, PCOS (polycystic ovarian syndrome), and Vaccine for human papilloma virus (HPV) types 6, 11, 16, and 18 administered.  has a past surgical history that includes Wisdom tooth extraction (2005) and Colposcopy (2004). Prior to Admission medications   Medication Sig Start Date End Date Taking? Authorizing Provider  Azelaic Acid  15 % gel Apply 1 application  topically as directed. After skin is thoroughly washed and patted dry, gently but thoroughly massage a thin film of  azelaic acid  gel into the affected area one to two times daily, in the morning and evening. 02/12/23   Jackquline Sawyer, MD  bimatoprost  (LATISSE ) 0.03 % ophthalmic solution Place 1 application. into both eyes at bedtime. Place one drop on applicator and apply evenly along the skin of the upper eyelid at base of eyelashes once daily at bedtime; repeat procedure for second eye (use a clean applicator). 06/07/21   Stewart, Tara, MD  Cholecalciferol (VITAMIN D3) 2000 units capsule Take by mouth.    [provider]  clobetasol  ointment (TEMOVATE ) 0.05 % Apply 1 application topically daily for 2-4 weeks as needed for symptoms 01/23/23   Copland, Alicia B, PA-C  Dextromethorphan -guaiFENesin  (MUCINEX  DM MAXIMUM STRENGTH) 60-1200 MG TB12 Take 1 tablet by mouth 2 (two) times daily as needed. For cough/congestion 12/14/23   Clark, Katherine K, NP  fluticasone  (FLONASE ) 50 MCG/ACT nasal spray Place 2 sprays into both nostrils daily. 12/11/23   Vincente Shivers, NP  ketoconazole  (NIZORAL ) 2 % shampoo Apply to scalp and ears once daily as directed. 09/18/22   Jackquline Sawyer, MD  levothyroxine  (SYNTHROID ) 112 MCG tablet Take 1 tablet (112 mcg total) by mouth every morning on an empty stomach with water only. No food or other medications for 30 minutes. 10/30/23   Clark, Katherine K, NP  Multiple Vitamin (MULTI-VITAMINS) TABS Take by mouth.    [provider]  promethazine -dextromethorphan  (PROMETHAZINE -DM) 6.25-15 MG/5ML syrup Take 5 mLs by mouth 4 (four) times daily as needed. 12/18/23   Vincente Shivers, NP  Roflumilast , Antiseborrheic, (ZORYVE ) 0.3 % FOAM Apply 1 Application topically daily. Apply to affected areas in and behind ears. 09/04/22   Jackquline Sawyer, MD  semaglutide -weight management (WEGOVY ) 0.25 MG/0.5ML SOAJ SQ injection Inject 0.25 mg into the skin once a week. 12/30/23   Clark, Katherine K, NP  spironolactone  (ALDACTONE ) 100 MG tablet Take 1 tablet (100 mg total) by mouth daily. 09/04/22   Jackquline Sawyer, MD  SUMAtriptan  (IMITREX ) 50 MG tablet Take 1 tablet by mouth at migraine onset. May repeat in 2 hours if headache persists or recurs. 12/01/22   Gretta Comer POUR, NP  Tretinoin  (ALTRENO ) 0.05 % LOTN Apply 1 Application topically at bedtime. Qhs to face 05/03/22   Moye, [place] , MD   No Known Allergies  FAMILY HISTORY:  family history includes Arthritis in her paternal grandfather; Breast cancer (age of onset: 110) in her paternal grandmother; COPD in her maternal grandfather; Depression in her brother, maternal grandmother, and paternal grandmother; Diabetes in her maternal grandfather and maternal grandmother; Heart disease in her paternal grandfather; Hyperlipidemia in her father, mother, and paternal grandfather; Hypertension in her father, mother, and paternal grandfather; Kidney cancer in her father; Kidney disease in her paternal grandfather; Lung cancer in her paternal grandfather; Prostate cancer in her father; Stroke in her maternal grandfather and maternal grandmother. SOCIAL HISTORY:  reports that she has never smoked. She has never used smokeless tobacco. She reports current alcohol use. She reports that she does not currently use drugs.     VITAL SIGNS: @VSRANGES @     Review of Systems: Gen:  Denies  fever, sweats, chills weight loss  HEENT: Denies blurred vision, double vision, ear pain, eye pain, hearing loss, nose bleeds, sore throat Cardiac:  No dizziness, chest pain or heaviness, chest tightness,edema, No JVD Resp:   No cough, -sputum production, -shortness of breath,-wheezing, -hemoptysis,  Other:  All other systems negative   Physical Examination:   General Appearance: No distress  EYES PERRLA, EOM intact.   NECK Supple, No JVD Pulmonary: normal breath sounds, No wheezing.  CardiovascularNormal S1,S2.  No m/r/g.   Abdomen: Benign, Soft, non-tender. Neurology UE/LE 5/5 strength, no focal deficits Ext pulses intact, cap refill intact ALL OTHER ROS ARE  NEGATIVE     ASSESSMENT AND PLAN SYNOPSIS  Patient with signs and symptoms of excessive daytime sleepiness with probable underlying diagnosis of obstructive sleep apnea in the setting of obesity and deconditioned state   Recommend Sleep Study for definitve diagnosis  Obesity -recommend significant weight loss -recommend changing diet  Deconditioned state -Recommend increased daily activity and exercise   MEDICATION ADJUSTMENTS/LABS AND TESTS ORDERED: Recommend Sleep Study Recommend weight loss   CURRENT MEDICATIONS REVIEWED AT LENGTH WITH PATIENT TODAY   Patient  satisfied with Plan of action and management. All questions answered   Follow up    I spent a total of *** minutes dedicated to the care of this patient on the date of this encounter to include pre-visit review of records, face-to-face time with the patient discussing conditions above, post visit ordering of testing, clinical documentation with the electronic health record, making appropriate referrals as documented, and communicating necessary information to the patient's healthcare team.     Nickolas Alm Cellar, M.D.  [facility]  Medical Director [facility] [place]
